# Patient Record
Sex: Male | Born: 1950 | Race: White | Hispanic: No | Marital: Married | Smoking: Never smoker
Health system: Southern US, Community
[De-identification: ages and names within clinical notes are randomized; demographics above are authoritative.]

## PROBLEM LIST (undated history)

## (undated) DIAGNOSIS — E785 Hyperlipidemia, unspecified: Secondary | ICD-10-CM

## (undated) DIAGNOSIS — I10 Essential (primary) hypertension: Secondary | ICD-10-CM

## (undated) HISTORY — DX: Hyperlipidemia, unspecified: E78.5

## (undated) HISTORY — PX: SHOULDER SURGERY: SHX246

## (undated) HISTORY — PX: BACK SURGERY: SHX140

## (undated) HISTORY — PX: CARDIAC VALVE REPLACEMENT: SHX585

## (undated) HISTORY — DX: Essential (primary) hypertension: I10

---

## 2010-12-22 ENCOUNTER — Ambulatory Visit (INDEPENDENT_AMBULATORY_CARE_PROVIDER_SITE_OTHER): Payer: BC Managed Care – PPO | Admitting: Internal Medicine

## 2010-12-22 ENCOUNTER — Encounter: Payer: Self-pay | Admitting: Internal Medicine

## 2010-12-22 VITALS — BP 122/80 | HR 70 | Temp 98.3°F | Resp 16 | Ht 69.5 in | Wt 200.0 lb

## 2010-12-22 DIAGNOSIS — Z0000 Encounter for general adult medical examination without abnormal findings: Secondary | ICD-10-CM

## 2010-12-22 MED ORDER — LISINOPRIL-HYDROCHLOROTHIAZIDE 10-12.5 MG PO TABS: 1.0000 | ORAL_TABLET | Freq: Every day | ORAL | Status: DC

## 2010-12-22 MED ORDER — SIMVASTATIN 20 MG PO TABS: 20.0000 mg | ORAL_TABLET | Freq: Every day | ORAL | Status: DC

## 2010-12-22 NOTE — Progress Notes (Signed)
Subjective:    Patient ID: Daniel Robinson, male    DOB: Jun 22, 1950, 60 y.o.   MRN: [id number]  HPI  60 year old patient who is in today to establish with our practice. He has relocated from [facility] approximately 4 months ago. He has treated hypertension and dyslipidemia. No real concerns or complaints Past medical history hypertension dyslipidemia. 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 He has lumbar disc disease and has had a laminectomy in the past. He has some chronic low back pain Surgical history right shoulder surgery 1996 lumbar laminectomy 1997. 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 He has one son and 5 adopted children;  is a lifelong nonsmoker  Family history father died age 7 complications of senile dementia. He had a fall resulted in a fractured neck Mother died age 29 for renal cancer 2 brothers in good health    Review of Systems  Constitutional: Negative for fever, chills, activity change, appetite change and fatigue.  HENT: Negative for hearing loss, ear pain, congestion, rhinorrhea, sneezing, mouth sores, trouble swallowing, neck pain, neck stiffness, dental problem, voice change, sinus pressure and tinnitus.   Eyes: Negative for photophobia, pain, redness and visual disturbance.  Respiratory: Negative for apnea, cough, choking, chest tightness, shortness of breath and wheezing.   Cardiovascular: Negative for chest pain, palpitations and leg swelling.  Gastrointestinal: Negative for nausea, vomiting, abdominal pain, diarrhea, constipation, blood in stool, abdominal distention, anal bleeding and rectal pain.  Genitourinary: Negative for dysuria, urgency, frequency, hematuria, flank pain, decreased urine volume, discharge, penile swelling, scrotal swelling, difficulty urinating, genital sores and testicular pain.    Musculoskeletal: Positive for back pain. Negative for myalgias, joint swelling, arthralgias and gait problem.  Skin: Negative for color change, rash and wound.  Neurological: Negative for dizziness, tremors, seizures, syncope, facial asymmetry, speech difficulty, weakness, light-headedness, numbness and headaches.  Hematological: Negative for adenopathy. Does not bruise/bleed easily.  Psychiatric/Behavioral: Negative for suicidal ideas, hallucinations, behavioral problems, confusion, sleep disturbance, self-injury, dysphoric mood, decreased concentration and agitation. The patient is not nervous/anxious.        Objective:   Physical Exam  Constitutional: He appears well-developed and well-nourished.  HENT:  Head: Normocephalic and atraumatic.  Right Ear: External ear normal.  Left Ear: External ear normal.  Nose: Nose normal.  Mouth/Throat: Oropharynx is clear and moist.  Eyes: Conjunctivae and EOM are normal. Pupils are equal, round, and reactive to light. No scleral icterus.  Neck: Normal range of motion. Neck supple. No JVD present. No thyromegaly present.  Cardiovascular: Regular rhythm, normal heart sounds and intact distal pulses.  Exam reveals no gallop and no friction rub.   No murmur heard. Pulmonary/Chest: Effort normal and breath sounds normal. He exhibits no tenderness.  Abdominal: Soft. Bowel sounds are normal. He exhibits no distension and no mass. There is no tenderness.  Genitourinary: Prostate normal and penis normal. Guaiac negative stool.  Musculoskeletal: Normal range of motion. He exhibits no edema and no tenderness.  Lymphadenopathy:    He has no cervical adenopathy.  Neurological: He is alert. He has normal reflexes. No cranial nerve deficit. Coordination normal.  Skin: Skin is warm and dry. No rash noted.  Psychiatric: He has a normal mood and affect. His behavior is normal.          Assessment & Plan:   Hypertension. We'll resume the  lisinopril  hydrochlorothiazide Dyslipidemia. We'll resume simvastatin  Recheck 6 months Colonoscopy encouraged

## 2010-12-22 NOTE — Patient Instructions (Signed)
Limit your sodium (Salt) intake    It is important that you exercise regularly, at least 20 minutes 3 to 4 times per week.  If you develop chest pain or shortness of breath seek  medical attention.   Please check your blood pressure on a regular basis.  If it is consistently greater than 150/90, please make an office appointment.  Schedule your colonoscopy to help detect colon cancer.  Return in 6 months for follow-up  

## 2012-01-16 ENCOUNTER — Other Ambulatory Visit: Payer: Self-pay | Admitting: Internal Medicine

## 2012-08-23 ENCOUNTER — Encounter: Payer: Self-pay | Admitting: Internal Medicine

## 2012-08-23 ENCOUNTER — Ambulatory Visit (INDEPENDENT_AMBULATORY_CARE_PROVIDER_SITE_OTHER): Payer: BC Managed Care – PPO | Admitting: Internal Medicine

## 2012-08-23 VITALS — BP 140/90 | HR 76 | Temp 99.0°F | Resp 20 | Wt 201.0 lb

## 2012-08-23 DIAGNOSIS — I10 Essential (primary) hypertension: Secondary | ICD-10-CM

## 2012-08-23 DIAGNOSIS — H6120 Impacted cerumen, unspecified ear: Secondary | ICD-10-CM

## 2012-08-23 DIAGNOSIS — H6123 Impacted cerumen, bilateral: Secondary | ICD-10-CM

## 2012-08-23 MED ORDER — LISINOPRIL-HYDROCHLOROTHIAZIDE 10-12.5 MG PO TABS: ORAL_TABLET | ORAL | Status: DC

## 2012-08-23 MED ORDER — SIMVASTATIN 20 MG PO TABS: ORAL_TABLET | ORAL | Status: DC

## 2012-08-23 NOTE — Patient Instructions (Signed)
Limit your sodium (Salt) intake  Please check your blood pressure on a regular basis.  If it is consistently greater than 150/90, please make an office appointment.  Return in 6 months for follow-up   

## 2012-08-23 NOTE — Progress Notes (Signed)
  Subjective:    Patient ID: Daniel Robinson, male    DOB: 09/26/1950, 62 y.o.   MRN: [id number]  HPI  62 year old patient who has hypertension and dyslipidemia. He is here today with a cerumen impaction. Review of Systems  HENT: Positive for hearing loss.        Objective:   Physical Exam  Constitutional: He appears well-developed and well-nourished. No distress.  Blood pressure 140/90 off medicine  HENT:  Bilateral cerumen impactions. Canals irrigated until clear          Assessment & Plan:  Cerumen impactions. Irrigated Hypertension stable  Medicines refilled schedule CPX

## 2012-12-13 ENCOUNTER — Other Ambulatory Visit: Payer: BC Managed Care – PPO

## 2012-12-16 ENCOUNTER — Other Ambulatory Visit (INDEPENDENT_AMBULATORY_CARE_PROVIDER_SITE_OTHER): Payer: BC Managed Care – PPO

## 2012-12-16 DIAGNOSIS — Z0000 Encounter for general adult medical examination without abnormal findings: Secondary | ICD-10-CM

## 2012-12-16 LAB — TSH: TSH: 1.43 u[IU]/mL (ref 0.35–5.50)

## 2012-12-16 LAB — LIPID PANEL
LDL Cholesterol: 101 mg/dL — ABNORMAL HIGH (ref 0–99)
Triglycerides: 179 mg/dL — ABNORMAL HIGH (ref 0.0–149.0)
VLDL: 35.8 mg/dL (ref 0.0–40.0)

## 2012-12-16 LAB — PSA: PSA: 0.98 ng/mL (ref 0.10–4.00)

## 2012-12-16 LAB — CBC WITH DIFFERENTIAL/PLATELET
Basophils Absolute: 0 10*3/uL (ref 0.0–0.1)
Eosinophils Absolute: 0.3 10*3/uL (ref 0.0–0.7)
Hemoglobin: 15.2 g/dL (ref 13.0–17.0)
Lymphocytes Relative: 33.2 % (ref 12.0–46.0)
MCHC: 34.6 g/dL (ref 30.0–36.0)
Neutro Abs: 4.3 10*3/uL (ref 1.4–7.7)
Platelets: 244 10*3/uL (ref 150.0–400.0)
RDW: 13.1 % (ref 11.5–14.6)

## 2012-12-16 LAB — POCT URINALYSIS DIPSTICK
Ketones, UA: NEGATIVE
Leukocytes, UA: NEGATIVE
Protein, UA: NEGATIVE
Spec Grav, UA: 1.015
Urobilinogen, UA: 0.2
pH, UA: 7

## 2012-12-16 LAB — BASIC METABOLIC PANEL
BUN: 16 mg/dL (ref 6–23)
CO2: 30 mEq/L (ref 19–32)
Calcium: 9.5 mg/dL (ref 8.4–10.5)
Creatinine, Ser: 0.9 mg/dL (ref 0.4–1.5)
Glucose, Bld: 86 mg/dL (ref 70–99)
Sodium: 139 mEq/L (ref 135–145)

## 2012-12-16 LAB — HEPATIC FUNCTION PANEL
Albumin: 4.4 g/dL (ref 3.5–5.2)
Alkaline Phosphatase: 37 U/L — ABNORMAL LOW (ref 39–117)

## 2012-12-20 ENCOUNTER — Ambulatory Visit (INDEPENDENT_AMBULATORY_CARE_PROVIDER_SITE_OTHER): Payer: BC Managed Care – PPO | Admitting: Internal Medicine

## 2012-12-20 ENCOUNTER — Encounter: Payer: Self-pay | Admitting: Internal Medicine

## 2012-12-20 VITALS — BP 122/86 | HR 65 | Temp 98.3°F | Resp 20 | Ht 69.5 in | Wt 205.0 lb

## 2012-12-20 DIAGNOSIS — D2360 Other benign neoplasm of skin of unspecified upper limb, including shoulder: Secondary | ICD-10-CM

## 2012-12-20 DIAGNOSIS — M7661 Achilles tendinitis, right leg: Secondary | ICD-10-CM

## 2012-12-20 DIAGNOSIS — I10 Essential (primary) hypertension: Secondary | ICD-10-CM

## 2012-12-20 DIAGNOSIS — D2361 Other benign neoplasm of skin of right upper limb, including shoulder: Secondary | ICD-10-CM

## 2012-12-20 DIAGNOSIS — Z23 Encounter for immunization: Secondary | ICD-10-CM

## 2012-12-20 DIAGNOSIS — M7660 Achilles tendinitis, unspecified leg: Secondary | ICD-10-CM

## 2012-12-20 MED ORDER — LISINOPRIL-HYDROCHLOROTHIAZIDE 10-12.5 MG PO TABS: ORAL_TABLET | ORAL | Status: DC

## 2012-12-20 MED ORDER — SIMVASTATIN 20 MG PO TABS: ORAL_TABLET | ORAL | Status: DC

## 2012-12-20 NOTE — Progress Notes (Signed)
Subjective:    Patient ID: Daniel Robinson, male    DOB: April 18, 1950, 62 y.o.   MRN: [id number]  HPI Subjective:    Patient ID: Daniel Robinson, male    DOB: 1950/04/19, 62 y.o.   MRN: [id number]  HPI  62 year-old patient who is in today for an annual exam. He is status with the practice approximately 2 years ago. He has relocated from [facility]. He has treated hypertension and dyslipidemia. No real concerns or complaints   Past medical history hypertension dyslipidemia. 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 He has lumbar disc disease and has had a laminectomy in the past. He has some chronic low back pain  Surgical history right shoulder surgery 1996 lumbar laminectomy 1997. 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 He has one son and 5 adopted children;  is a lifelong nonsmoker  Family history father died age 78 complications of senile dementia. He had a fall resulted in a fractured neck Mother died age 40  From renal cancer 2 brothers in good health    Review of Systems  Constitutional: Negative for fever, chills, activity change, appetite change and fatigue.  HENT: Negative for hearing loss, ear pain, congestion, rhinorrhea, sneezing, mouth sores, trouble swallowing, neck pain, neck stiffness, dental problem, voice change, sinus pressure and tinnitus.   Eyes: Negative for photophobia, pain, redness and visual disturbance.  Respiratory: Negative for apnea, cough, choking, chest tightness, shortness of breath and wheezing.   Cardiovascular: Negative for chest pain, palpitations and leg swelling.  Gastrointestinal: Negative for nausea, vomiting, abdominal pain, diarrhea, constipation, blood in stool, abdominal distention, anal bleeding and rectal pain.  Genitourinary: Negative for dysuria, urgency, frequency, hematuria, flank  pain, decreased urine volume, discharge, penile swelling, scrotal swelling, difficulty urinating, genital sores and testicular pain.  Musculoskeletal: Positive for back pain. Negative for myalgias, joint swelling, arthralgias and gait problem.  Skin: Negative for color change, rash and wound.  Neurological: Negative for dizziness, tremors, seizures, syncope, facial asymmetry, speech difficulty, weakness, light-headedness, numbness and headaches.  Hematological: Negative for adenopathy. Does not bruise/bleed easily.  Psychiatric/Behavioral: Negative for suicidal ideas, hallucinations, behavioral problems, confusion, sleep disturbance, self-injury, dysphoric mood, decreased concentration and agitation. The patient is not nervous/anxious.        Objective:   Physical Exam  Constitutional: He appears well-developed and well-nourished.  HENT:  Head: Normocephalic and atraumatic.  Right Ear: External ear normal.  Left Ear: External ear normal.  Nose: Nose normal.  Mouth/Throat: Oropharynx is clear and moist.  Eyes: Conjunctivae and EOM are normal. Pupils are equal, round, and reactive to light. No scleral icterus.  Neck: Normal range of motion. Neck supple. No JVD present. No thyromegaly present.  Cardiovascular: Regular rhythm, normal heart sounds and intact distal pulses.  Exam reveals no gallop and no friction rub.   No murmur heard. Pulmonary/Chest: Effort normal and breath sounds normal. He exhibits no tenderness.  Abdominal: Soft. Bowel sounds are normal. He exhibits no distension and no mass. There is no tenderness.  Genitourinary: Prostate normal and penis normal. Guaiac negative stool.  Musculoskeletal: Normal range of motion. He exhibits no edema and no tenderness.  Lymphadenopathy:    He has no cervical adenopathy.  Neurological: He is alert. He has normal reflexes. No cranial nerve deficit. Coordination normal.  Skin: Skin is warm and dry. No rash  noted.  Psychiatric: He has a  normal mood and affect. His behavior is normal.          Assessment & Plan:   Hypertension. We'll resume the lisinopril hydrochlorothiazide Dyslipidemia. We'll resume simvastatin  Recheck 6 months Colonoscopy encouraged    Review of Systems See above    Objective:   Physical Exam  Musculoskeletal:  Soft tissue swelling right distal Achilles tendon near insertion site  Skin:  Approximate 8mm pigmented lesion right shoulder area    See above      Assessment & Plan:   Preventive health exam Colonoscopy encouraged Rule out melanoma right shoulder area Right Achilles tendinopathy. We'll set up for orthopedic evaluation  Hypertension stable Dyslipidemia  Continue home blood pressure monitoring and statin therapy

## 2012-12-20 NOTE — Addendum Note (Signed)
Addended by: Jimmye Norman on: 12/20/2012 12:09 PM   Modules accepted: Orders

## 2012-12-20 NOTE — Patient Instructions (Addendum)
Limit your sodium (Salt) intake    It is important that you exercise regularly, at least 20 minutes 3 to 4 times per week.  If you develop chest pain or shortness of breath seek  medical attention.  Please check your blood pressure on a regular basis.  If it is consistently greater than 150/90, please make an office appointment.  Return in one year for follow-up  Schedule your colonoscopy to help detect colon cancer.Achilles Tendinitis  with Rehab Achilles tendinitis is a disorder of the Achilles tendon. The Achilles tendon connects the large calf muscles (Gastrocnemius and Soleus) to the heel bone (calcaneus). This tendon is sometimes called the heel cord. It is important for pushing-off and standing on your toes and is important for walking, running, or jumping. Tendinitis is often caused by overuse and repetitive microtrauma. SYMPTOMS  Pain, tenderness, swelling, warmth, and redness may occur over the Achilles tendon even at rest.  Pain with pushing off, or flexing or extending the ankle.  Pain that is worsened after or during activity. CAUSES   Overuse sometimes seen with rapid increase in exercise programs or in sports requiring running and jumping.  Poor physical conditioning (strength and flexibility or endurance).  Running sports, especially training running down hills.  Inadequate warm-up before practice or play or failure to stretch before participation.  Injury to the tendon. PREVENTION   Warm up and stretch before practice or competition.  Allow time for adequate rest and recovery between practices and competition.  Keep up conditioning.  Keep up ankle and leg flexibility.  Improve or keep muscle strength and endurance.  Improve cardiovascular fitness.  Use proper technique.  Use proper equipment (shoes, skates).  To help prevent recurrence, taping, protective strapping, or an adhesive bandage may be recommended for several weeks after healing is  complete. PROGNOSIS   Recovery may take weeks to several months to heal.  Longer recovery is expected if symptoms have been prolonged.  Recovery is usually quicker if the inflammation is due to a direct blow as compared with overuse or sudden strain. RELATED COMPLICATIONS   Healing time will be prolonged if the condition is not correctly treated. The injury must be given plenty of time to heal.  Symptoms can reoccur if activity is resumed too soon.  Untreated, tendinitis may increase the risk of tendon rupture requiring additional time for recovery and possibly surgery. TREATMENT   The first treatment consists of rest anti-inflammatory medication, and ice to relieve the pain.  Stretching and strengthening exercises after resolution of pain will likely help reduce the risk of recurrence. Referral to a physical therapist or athletic trainer for further evaluation and treatment may be helpful.  A walking boot or cast may be recommended to rest the Achilles tendon. This can help break the cycle of inflammation and microtrauma.  Arch supports (orthotics) may be prescribed or recommended by your caregiver as an adjunct to therapy and rest.  Surgery to remove the inflamed tendon lining or degenerated tendon tissue is rarely necessary and has shown less than predictable results. MEDICATION   Nonsteroidal anti-inflammatory medications, such as aspirin and ibuprofen, may be used for pain and inflammation relief. Do not take within 7 days before surgery. Take these as directed by your caregiver. Contact your caregiver immediately if any bleeding, stomach upset, or signs of allergic reaction occur. Other minor pain relievers, such as acetaminophen, may also be used.  Pain relievers may be prescribed as necessary by your caregiver. Do not take prescription pain  medication for longer than 4 to 7 days. Use only as directed and only as much as you need.  Cortisone injections are rarely indicated.  Cortisone injections may weaken tendons and predispose to rupture. It is better to give the condition more time to heal than to use them. HEAT AND COLD  Cold is used to relieve pain and reduce inflammation for acute and chronic Achilles tendinitis. Cold should be applied for 10 to 15 minutes every 2 to 3 hours for inflammation and pain and immediately after any activity that aggravates your symptoms. Use ice packs or an ice massage.  Heat may be used before performing stretching and strengthening activities prescribed by your caregiver. Use a heat pack or a warm soak. SEEK MEDICAL CARE IF:  Symptoms get worse or do not improve in 2 weeks despite treatment.  New, unexplained symptoms develop. Drugs used in treatment may produce side effects. EXERCISES RANGE OF MOTION (ROM) AND STRETCHING EXERCISES - Achilles Tendinitis  These exercises may help you when beginning to rehabilitate your injury. Your symptoms may resolve with or without further involvement from your physician, physical therapist or athletic trainer. While completing these exercises, remember:   Restoring tissue flexibility helps normal motion to return to the joints. This allows healthier, less painful movement and activity.  An effective stretch should be held for at least 30 seconds.  A stretch should never be painful. You should only feel a gentle lengthening or release in the stretched tissue. STRETCH  Gastroc, Standing   Place hands on wall.  Extend right / left leg, keeping the front knee somewhat bent.  Slightly point your toes inward on your back foot.  Keeping your right / left heel on the floor and your knee straight, shift your weight toward the wall, not allowing your back to arch.  You should feel a gentle stretch in the right / left calf. Hold this position for __________ seconds. Repeat __________ times. Complete this stretch __________ times per day. STRETCH  Soleus, Standing   Place hands on  wall.  Extend right / left leg, keeping the other knee somewhat bent.  Slightly point your toes inward on your back foot.  Keep your right / left heel on the floor, bend your back knee, and slightly shift your weight over the back leg so that you feel a gentle stretch deep in your back calf.  Hold this position for __________ seconds. Repeat __________ times. Complete this stretch __________ times per day. STRETCH  Gastrocsoleus, Standing  Note: This exercise can place a lot of stress on your foot and ankle. Please complete this exercise only if specifically instructed by your caregiver.   Place the ball of your right / left foot on a step, keeping your other foot firmly on the same step.  Hold on to the wall or a rail for balance.  Slowly lift your other foot, allowing your body weight to press your heel down over the edge of the step.  You should feel a stretch in your right / left calf.  Hold this position for __________ seconds.  Repeat this exercise with a slight bend in your knee. Repeat __________ times. Complete this stretch __________ times per day.  STRENGTHENING EXERCISES - Achilles Tendinitis These exercises may help you when beginning to rehabilitate your injury. They may resolve your symptoms with or without further involvement from your physician, physical therapist or athletic trainer. While completing these exercises, remember:   Muscles can gain both the endurance and  the strength needed for everyday activities through controlled exercises.  Complete these exercises as instructed by your physician, physical therapist or athletic trainer. Progress the resistance and repetitions only as guided.  You may experience muscle soreness or fatigue, but the pain or discomfort you are trying to eliminate should never worsen during these exercises. If this pain does worsen, stop and make certain you are following the directions exactly. If the pain is still present after  adjustments, discontinue the exercise until you can discuss the trouble with your clinician. STRENGTH - Plantar-flexors   Sit with your right / left leg extended. Holding onto both ends of a rubber exercise band/tubing, loop it around the ball of your foot. Keep a slight tension in the band.  Slowly push your toes away from you, pointing them downward.  Hold this position for __________ seconds. Return slowly, controlling the tension in the band/tubing. Repeat __________ times. Complete this exercise __________ times per day.  STRENGTH - Plantar-flexors   Stand with your feet shoulder width apart. Steady yourself with a wall or table using as little support as needed.  Keeping your weight evenly spread over the width of your feet, rise up on your toes.*  Hold this position for __________ seconds. Repeat __________ times. Complete this exercise __________ times per day.  *If this is too easy, shift your weight toward your right / left leg until you feel challenged. Ultimately, you may be asked to do this exercise with your right / left foot only. STRENGTH  Plantar-flexors, Eccentric  Note: This exercise can place a lot of stress on your foot and ankle. Please complete this exercise only if specifically instructed by your caregiver.   Place the balls of your feet on a step. With your hands, use only enough support from a wall or rail to keep your balance.  Keep your knees straight and rise up on your toes.  Slowly shift your weight entirely to your right / left toes and pick up your opposite foot. Gently and with controlled movement, lower your weight through your right / left foot so that your heel drops below the level of the step. You will feel a slight stretch in the back of your calf at the end position.  Use the healthy leg to help rise up onto the balls of both feet, then lower weight only on the right / left leg again. Build up to 15 repetitions. Then progress to 3 consecutive sets  of 15 repetitions.*  After completing the above exercise, complete the same exercise with a slight knee bend (about 30 degrees). Again, build up to 15 repetitions. Then progress to 3 consecutive sets of 15 repetitions.* Perform this exercise __________ times per day.  *When you easily complete 3 sets of 15, your physician, physical therapist or athletic trainer may advise you to add resistance by wearing a backpack filled with additional weight. STRENGTH - Plantar Flexors, Seated   Sit on a chair that allows your feet to rest flat on the ground. If necessary, sit at the edge of the chair.  Keeping your toes firmly on the ground, lift your right / left heel as far as you can without increasing any discomfort in your ankle. Repeat __________ times. Complete this exercise __________ times a day. *If instructed by your physician, physical therapist or athletic trainer, you may add ____________________ of resistance by placing a weighted object on your right / left knee. Document Released: 10/12/2004 Document Revised: 06/05/2011 Document Reviewed: 06/25/2008 [employer]  Patient Information 2014 [employer]  

## 2013-01-18 ENCOUNTER — Other Ambulatory Visit: Payer: Self-pay | Admitting: Internal Medicine

## 2013-03-23 ENCOUNTER — Other Ambulatory Visit: Payer: Self-pay | Admitting: Internal Medicine

## 2013-04-30 ENCOUNTER — Other Ambulatory Visit: Payer: Self-pay | Admitting: Internal Medicine

## 2013-05-31 ENCOUNTER — Other Ambulatory Visit: Payer: Self-pay | Admitting: Internal Medicine

## 2013-07-31 ENCOUNTER — Other Ambulatory Visit: Payer: Self-pay | Admitting: Internal Medicine

## 2013-10-02 ENCOUNTER — Other Ambulatory Visit: Payer: Self-pay | Admitting: Internal Medicine

## 2013-11-30 ENCOUNTER — Other Ambulatory Visit: Payer: Self-pay | Admitting: Internal Medicine

## 2013-12-31 ENCOUNTER — Other Ambulatory Visit: Payer: Self-pay | Admitting: Internal Medicine

## 2014-03-02 ENCOUNTER — Other Ambulatory Visit: Payer: Self-pay | Admitting: Internal Medicine

## 2014-04-02 ENCOUNTER — Other Ambulatory Visit: Payer: Self-pay | Admitting: Internal Medicine

## 2014-05-02 ENCOUNTER — Other Ambulatory Visit: Payer: Self-pay | Admitting: Internal Medicine

## 2015-01-13 ENCOUNTER — Encounter: Payer: Self-pay | Admitting: Internal Medicine

## 2015-01-13 ENCOUNTER — Ambulatory Visit (INDEPENDENT_AMBULATORY_CARE_PROVIDER_SITE_OTHER): Payer: BLUE CROSS/BLUE SHIELD | Admitting: Internal Medicine

## 2015-01-13 VITALS — BP 152/100 | HR 66 | Temp 98.3°F | Resp 20 | Ht 69.5 in | Wt 200.0 lb

## 2015-01-13 DIAGNOSIS — Z23 Encounter for immunization: Secondary | ICD-10-CM

## 2015-01-13 DIAGNOSIS — E785 Hyperlipidemia, unspecified: Secondary | ICD-10-CM

## 2015-01-13 DIAGNOSIS — I10 Essential (primary) hypertension: Secondary | ICD-10-CM | POA: Diagnosis not present

## 2015-01-13 DIAGNOSIS — E782 Mixed hyperlipidemia: Secondary | ICD-10-CM | POA: Insufficient documentation

## 2015-01-13 MED ORDER — LISINOPRIL-HYDROCHLOROTHIAZIDE 10-12.5 MG PO TABS: 1.0000 | ORAL_TABLET | Freq: Every day | ORAL | Status: DC

## 2015-01-13 MED ORDER — SIMVASTATIN 20 MG PO TABS: 20.0000 mg | ORAL_TABLET | Freq: Every day | ORAL | Status: DC

## 2015-01-13 NOTE — Progress Notes (Signed)
Subjective:    Patient ID: Daniel Robinson, male    DOB: 04/11/1950, 64 y.o.   MRN: [id number]  HPI  BP Readings from Last 3 Encounters:  01/13/15 152/100  12/20/12 122/86  08/23/12 76/7   64 year old patient who has not been seen in over 2 years.  He has had issues with cerumen impactions in the past and presents with complaints of diminished auditory acuity bilaterally. He has been off medications for blood pressure and lipid control. Otherwise, no complaints  Social history.  Has a son that is stable following a bone marrow transplant for aplastic anemia and PNH  Past Medical History  Diagnosis Date  . Hyperlipidemia   . Hypertension     Social History   Social History  . Marital Status: Married    Spouse Name: N/A  . Number of Children: N/A  . Years of Education: N/A   Occupational History  . Not on file.   Social History Main Topics  . Smoking status: Never Smoker   . Smokeless tobacco: Never Used  . Alcohol Use: Yes  . Drug Use: No  . Sexual Activity: Not on file   Other Topics Concern  . Not on file   Social History Narrative    Past Surgical History  Procedure Laterality Date  . Back surgery    . Shoulder surgery      Family History  Problem Relation Age of Onset  . Hyperlipidemia Mother   . Hypertension Mother     No Known Allergies  No current outpatient prescriptions on file prior to visit.   No current facility-administered medications on file prior to visit.    BP 152/100 mmHg  Pulse 66  Temp(Src) 98.3 F (36.8 C) (Oral)  Resp 20  Ht 5' 9.5" (1.765 m)  Wt 200 lb (90.719 kg)  BMI 29.12 kg/m2  SpO2 98%      Review of Systems  Constitutional: Negative for fever, chills, appetite change and fatigue.  HENT: Negative for congestion, dental problem, ear pain, hearing loss, sore throat, tinnitus, trouble swallowing and voice change.   Eyes: Negative for pain, discharge and visual disturbance.  Respiratory: Negative for  cough, chest tightness, wheezing and stridor.   Cardiovascular: Negative for chest pain, palpitations and leg swelling.  Gastrointestinal: Negative for nausea, vomiting, abdominal pain, diarrhea, constipation, blood in stool and abdominal distention.  Genitourinary: Negative for urgency, hematuria, flank pain, discharge, difficulty urinating and genital sores.  Musculoskeletal: Negative for myalgias, back pain, joint swelling, arthralgias, gait problem and neck stiffness.  Skin: Negative for rash.  Neurological: Negative for dizziness, syncope, speech difficulty, weakness, numbness and headaches.  Hematological: Negative for adenopathy. Does not bruise/bleed easily.  Psychiatric/Behavioral: Negative for behavioral problems and dysphoric mood. The patient is not nervous/anxious.        Objective:   Physical Exam  Constitutional: He is oriented to person, place, and time. He appears well-developed.  Blood pressure 160/100  HENT:  Head: Normocephalic.  Right Ear: External ear normal.  Left Ear: External ear normal.  Eyes: Conjunctivae and EOM are normal.  Neck: Normal range of motion.  Cardiovascular: Normal rate and normal heart sounds.   Pulmonary/Chest: Breath sounds normal.  Abdominal: Bowel sounds are normal.  Musculoskeletal: Normal range of motion. He exhibits no edema or tenderness.  Neurological: He is alert and oriented to person, place, and time.  Psychiatric: He has a normal mood and affect. His behavior is normal.  Assessment & Plan:   Bilateral cerumen impactions.  Procedure note -both canals irrigated until clear Hypertension.  Poor control off medication.  Compliance issues discussed and stressed.  Will resume combination therapy Dyslipidemia.  Resume statin therapy  Home blood pressure monitoring.  Encouraged Schedule CPX  Preventive health.  Flu vaccine administered

## 2015-01-13 NOTE — Progress Notes (Signed)
Pre visit review using our clinic review tool, if applicable. No additional management support is needed unless otherwise documented below in the visit note. 

## 2015-01-13 NOTE — Patient Instructions (Signed)
Limit your sodium (Salt) intake  Please check your blood pressure on a regular basis.  If it is consistently greater than 150/90, please make an office appointment.    It is important that you exercise regularly, at least 20 minutes 3 to 4 times per week.  If you develop chest pain or shortness of breath seek  medical attention.  Return in 6 months for follow-up  

## 2015-07-21 ENCOUNTER — Other Ambulatory Visit: Payer: Self-pay | Admitting: Internal Medicine

## 2015-10-11 ENCOUNTER — Other Ambulatory Visit: Payer: Self-pay | Admitting: Internal Medicine

## 2015-10-11 NOTE — Telephone Encounter (Signed)
Rx refill sent to pharmacy. 

## 2015-10-13 DIAGNOSIS — Z85828 Personal history of other malignant neoplasm of skin: Secondary | ICD-10-CM | POA: Diagnosis not present

## 2015-10-13 DIAGNOSIS — L821 Other seborrheic keratosis: Secondary | ICD-10-CM | POA: Diagnosis not present

## 2015-10-13 DIAGNOSIS — B078 Other viral warts: Secondary | ICD-10-CM | POA: Diagnosis not present

## 2015-10-13 DIAGNOSIS — Z85820 Personal history of malignant melanoma of skin: Secondary | ICD-10-CM | POA: Diagnosis not present

## 2015-10-13 DIAGNOSIS — L814 Other melanin hyperpigmentation: Secondary | ICD-10-CM | POA: Diagnosis not present

## 2016-01-20 ENCOUNTER — Other Ambulatory Visit: Payer: Self-pay | Admitting: Internal Medicine

## 2016-04-14 ENCOUNTER — Other Ambulatory Visit: Payer: Self-pay | Admitting: Internal Medicine

## 2016-05-16 DIAGNOSIS — D1801 Hemangioma of skin and subcutaneous tissue: Secondary | ICD-10-CM | POA: Diagnosis not present

## 2016-05-16 DIAGNOSIS — L814 Other melanin hyperpigmentation: Secondary | ICD-10-CM | POA: Diagnosis not present

## 2016-05-16 DIAGNOSIS — L821 Other seborrheic keratosis: Secondary | ICD-10-CM | POA: Diagnosis not present

## 2016-05-16 DIAGNOSIS — Z85828 Personal history of other malignant neoplasm of skin: Secondary | ICD-10-CM | POA: Diagnosis not present

## 2016-07-24 ENCOUNTER — Other Ambulatory Visit: Payer: Self-pay | Admitting: Internal Medicine

## 2016-10-16 ENCOUNTER — Other Ambulatory Visit: Payer: Self-pay | Admitting: Internal Medicine

## 2016-11-06 DIAGNOSIS — D1801 Hemangioma of skin and subcutaneous tissue: Secondary | ICD-10-CM | POA: Diagnosis not present

## 2016-11-06 DIAGNOSIS — L814 Other melanin hyperpigmentation: Secondary | ICD-10-CM | POA: Diagnosis not present

## 2016-11-06 DIAGNOSIS — L821 Other seborrheic keratosis: Secondary | ICD-10-CM | POA: Diagnosis not present

## 2016-11-06 DIAGNOSIS — L820 Inflamed seborrheic keratosis: Secondary | ICD-10-CM | POA: Diagnosis not present

## 2016-11-06 DIAGNOSIS — D179 Benign lipomatous neoplasm, unspecified: Secondary | ICD-10-CM | POA: Diagnosis not present

## 2016-12-14 ENCOUNTER — Encounter: Payer: Self-pay | Admitting: Internal Medicine

## 2017-01-12 ENCOUNTER — Other Ambulatory Visit: Payer: Self-pay | Admitting: Internal Medicine

## 2017-01-12 ENCOUNTER — Other Ambulatory Visit: Payer: Self-pay

## 2017-01-12 MED ORDER — LISINOPRIL-HYDROCHLOROTHIAZIDE 10-12.5 MG PO TABS: 1.0000 | ORAL_TABLET | Freq: Every day | ORAL | 0 refills | Status: DC

## 2017-01-13 ENCOUNTER — Other Ambulatory Visit: Payer: Self-pay | Admitting: Internal Medicine

## 2017-04-13 ENCOUNTER — Other Ambulatory Visit: Payer: Self-pay | Admitting: Internal Medicine

## 2017-05-07 DIAGNOSIS — Z85820 Personal history of malignant melanoma of skin: Secondary | ICD-10-CM | POA: Diagnosis not present

## 2017-05-07 DIAGNOSIS — D1801 Hemangioma of skin and subcutaneous tissue: Secondary | ICD-10-CM | POA: Diagnosis not present

## 2017-05-07 DIAGNOSIS — B351 Tinea unguium: Secondary | ICD-10-CM | POA: Diagnosis not present

## 2017-05-07 DIAGNOSIS — L814 Other melanin hyperpigmentation: Secondary | ICD-10-CM | POA: Diagnosis not present

## 2017-07-14 ENCOUNTER — Other Ambulatory Visit: Payer: Self-pay | Admitting: Internal Medicine

## 2017-08-16 ENCOUNTER — Encounter: Payer: Self-pay | Admitting: Family Medicine

## 2017-08-16 ENCOUNTER — Ambulatory Visit: Payer: BLUE CROSS/BLUE SHIELD | Admitting: Family Medicine

## 2017-08-16 VITALS — BP 126/80 | HR 76 | Temp 98.2°F | Ht 69.5 in | Wt 213.4 lb

## 2017-08-16 DIAGNOSIS — H6123 Impacted cerumen, bilateral: Secondary | ICD-10-CM | POA: Diagnosis not present

## 2017-08-16 NOTE — Progress Notes (Signed)
   Subjective:    Patient ID: Daniel Robinson, male    DOB: 1950/07/31, 67 y.o.   MRN: [id number]  HPI Here for hearing loss in the right ear a few days ago. He has frequent wax build ups. No pain or discomfort.    Review of Systems  Constitutional: Negative.   HENT: Positive for hearing loss. Negative for congestion, ear discharge and ear pain.   Respiratory: Negative.   Cardiovascular: Negative.        Objective:   Physical Exam  Constitutional: He appears well-developed and well-nourished.  HENT:  Both ear canals are full of cerumen   Cardiovascular: Normal rate, regular rhythm, normal heart sounds and intact distal pulses.  Pulmonary/Chest: Effort normal and breath sounds normal.          Assessment & Plan:  Cerumen impactions, both were cleared with water irrigations.  Alysia Penna, MD

## 2017-10-15 ENCOUNTER — Other Ambulatory Visit: Payer: Self-pay | Admitting: Internal Medicine

## 2017-10-15 NOTE — Telephone Encounter (Signed)
Patient has upcoming CPE in August.

## 2017-11-14 ENCOUNTER — Other Ambulatory Visit: Payer: Self-pay | Admitting: Internal Medicine

## 2017-11-14 ENCOUNTER — Ambulatory Visit (INDEPENDENT_AMBULATORY_CARE_PROVIDER_SITE_OTHER): Payer: BLUE CROSS/BLUE SHIELD | Admitting: Internal Medicine

## 2017-11-14 ENCOUNTER — Encounter: Payer: Self-pay | Admitting: Internal Medicine

## 2017-11-14 VITALS — BP 100/70 | HR 83 | Temp 98.1°F | Ht 69.0 in | Wt 203.4 lb

## 2017-11-14 DIAGNOSIS — Z0000 Encounter for general adult medical examination without abnormal findings: Secondary | ICD-10-CM

## 2017-11-14 DIAGNOSIS — Z1211 Encounter for screening for malignant neoplasm of colon: Secondary | ICD-10-CM

## 2017-11-14 LAB — TSH: TSH: 1.09 u[IU]/mL (ref 0.35–4.50)

## 2017-11-14 LAB — CBC WITH DIFFERENTIAL/PLATELET
Basophils Absolute: 0 10*3/uL (ref 0.0–0.1)
Basophils Relative: 0.5 % (ref 0.0–3.0)
EOS PCT: 2.2 % (ref 0.0–5.0)
Eosinophils Absolute: 0.2 10*3/uL (ref 0.0–0.7)
HCT: 48.6 % (ref 39.0–52.0)
HEMOGLOBIN: 16.5 g/dL (ref 13.0–17.0)
Lymphocytes Relative: 26.4 % (ref 12.0–46.0)
Lymphs Abs: 2.3 10*3/uL (ref 0.7–4.0)
MCHC: 34 g/dL (ref 30.0–36.0)
MCV: 91.5 fl (ref 78.0–100.0)
MONOS PCT: 7.8 % (ref 3.0–12.0)
Monocytes Absolute: 0.7 10*3/uL (ref 0.1–1.0)
Neutro Abs: 5.4 10*3/uL (ref 1.4–7.7)
Neutrophils Relative %: 63.1 % (ref 43.0–77.0)
Platelets: 297 10*3/uL (ref 150.0–400.0)
RBC: 5.32 Mil/uL (ref 4.22–5.81)
RDW: 13.2 % (ref 11.5–15.5)
WBC: 8.6 10*3/uL (ref 4.0–10.5)

## 2017-11-14 LAB — COMPREHENSIVE METABOLIC PANEL
ALBUMIN: 4.6 g/dL (ref 3.5–5.2)
ALT: 19 U/L (ref 0–53)
AST: 15 U/L (ref 0–37)
Alkaline Phosphatase: 37 U/L — ABNORMAL LOW (ref 39–117)
BUN: 20 mg/dL (ref 6–23)
CHLORIDE: 101 meq/L (ref 96–112)
CO2: 29 mEq/L (ref 19–32)
Calcium: 10.2 mg/dL (ref 8.4–10.5)
Creatinine, Ser: 0.99 mg/dL (ref 0.40–1.50)
GFR: 80.66 mL/min (ref >60.00)
Glucose, Bld: 91 mg/dL (ref 70–99)
POTASSIUM: 4.1 meq/L (ref 3.5–5.1)
SODIUM: 139 meq/L (ref 135–145)
Total Bilirubin: 0.8 mg/dL (ref 0.2–1.2)
Total Protein: 7.5 g/dL (ref 6.0–8.3)

## 2017-11-14 LAB — LIPID PANEL
CHOL/HDL RATIO: 5
Cholesterol: 220 mg/dL — ABNORMAL HIGH (ref 0–200)
HDL: 40.2 mg/dL (ref >39.00)
NonHDL: 179.32
Triglycerides: 334 mg/dL — ABNORMAL HIGH (ref 0.0–149.0)
VLDL: 66.8 mg/dL — AB (ref 0.0–40.0)

## 2017-11-14 LAB — PSA: PSA: 1.96 ng/mL (ref 0.10–4.00)

## 2017-11-14 LAB — LDL CHOLESTEROL, DIRECT: LDL DIRECT: 119 mg/dL

## 2017-11-14 MED ORDER — SIMVASTATIN 20 MG PO TABS: ORAL_TABLET | ORAL | 4 refills | Status: DC

## 2017-11-14 MED ORDER — LISINOPRIL-HYDROCHLOROTHIAZIDE 10-12.5 MG PO TABS: 1.0000 | ORAL_TABLET | Freq: Every day | ORAL | 4 refills | Status: DC

## 2017-11-14 NOTE — Progress Notes (Signed)
Subjective:    Patient ID: Daniel Robinson, male    DOB: Aug 12, 1950, 67 y.o.   MRN: [id number]  HPI  67 year old patient who is seen infrequently and is in today for a annual evaluation. He has a history of essential hypertension and dyslipidemia. No concerns or complaints  No prior colonoscopies  Family history fairly noncontributory mother died at 37 of complications of renal cancer apparently had prediabetes.  Father died at 102 with history of dementia. 2 brothers older in apparent good health with obesity only  Social history married one biological son.  5 adopted children  Past Medical History:  Diagnosis Date  . Hyperlipidemia   . Hypertension      Social History   Socioeconomic History  . Marital status: Married    Spouse name: Not on file  . Number of children: Not on file  . Years of education: Not on file  . Highest education level: Not on file  Occupational History  . Not on file  Social Needs  . Financial resource strain: Not on file  . Food insecurity:    Worry: Not on file    Inability: Not on file  . Transportation needs:    Medical: Not on file    Non-medical: Not on file  Tobacco Use  . Smoking status: Never Smoker  . Smokeless tobacco: Never Used  Substance and Sexual Activity  . Alcohol use: Yes  . Drug use: No  . Sexual activity: Not on file  Lifestyle  . Physical activity:    Days per week: Not on file    Minutes per session: Not on file  . Stress: Not on file  Relationships  . Social connections:    Talks on phone: Not on file    Gets together: Not on file    Attends religious service: Not on file    Active member of club or organization: Not on file    Attends meetings of clubs or organizations: Not on file    Relationship status: Not on file  . Intimate partner violence:    Fear of current or ex partner: Not on file    Emotionally abused: Not on file    Physically abused: Not on file    Forced sexual activity: Not on file    Other Topics Concern  . Not on file  Social History Narrative  . Not on file    Past Surgical History:  Procedure Laterality Date  . BACK SURGERY    . SHOULDER SURGERY      Family History  Problem Relation Age of Onset  . Hyperlipidemia Mother   . Hypertension Mother     No Known Allergies  No current outpatient medications on file prior to visit.   No current facility-administered medications on file prior to visit.     BP 100/70 (BP Location: Right Arm, Patient Position: Sitting, Cuff Size: Normal)   Pulse 83   Temp 98.1 F (36.7 C) (Oral)   Ht 5\' 9"  (1.753 m)   Wt 203 lb 6.4 oz (92.3 kg)   SpO2 96%   BMI 30.04 kg/m     Review of Systems  Constitutional: Negative for appetite change, chills, fatigue and fever.  HENT: Negative for congestion, dental problem, ear pain, hearing loss, sore throat, tinnitus, trouble swallowing and voice change.   Eyes: Negative for pain, discharge and visual disturbance.  Respiratory: Negative for cough, chest tightness, wheezing and stridor.   Cardiovascular: Negative for chest pain, palpitations and  leg swelling.  Gastrointestinal: Negative for abdominal distention, abdominal pain, blood in stool, constipation, diarrhea, nausea and vomiting.  Genitourinary: Negative for difficulty urinating, discharge, flank pain, genital sores, hematuria and urgency.  Musculoskeletal: Negative for arthralgias, back pain, gait problem, joint swelling, myalgias and neck stiffness.  Skin: Negative for rash.  Neurological: Negative for dizziness, syncope, speech difficulty, weakness, numbness and headaches.  Hematological: Negative for adenopathy. Does not bruise/bleed easily.  Psychiatric/Behavioral: Negative for behavioral problems and dysphoric mood. The patient is not nervous/anxious.        Objective:   Physical Exam  Constitutional: He appears well-developed and well-nourished.  Blood pressure 100/64 Weight 203  HENT:  Head:  Normocephalic and atraumatic.  Right Ear: External ear normal.  Left Ear: External ear normal.  Nose: Nose normal.  Mouth/Throat: Oropharynx is clear and moist.  Eyes: Pupils are equal, round, and reactive to light. Conjunctivae and EOM are normal. No scleral icterus.  Neck: Normal range of motion. Neck supple. No JVD present. No thyromegaly present.  Cardiovascular: Regular rhythm, normal heart sounds and intact distal pulses. Exam reveals no gallop and no friction rub.  No murmur heard. Pulmonary/Chest: Effort normal and breath sounds normal. He exhibits no tenderness.  Abdominal: Soft. Bowel sounds are normal. He exhibits no distension and no mass. There is no tenderness.  Genitourinary: Prostate normal and penis normal. Rectal exam shows guaiac negative stool.  Musculoskeletal: Normal range of motion. He exhibits no edema or tenderness.  High arches Some onychomycotic toenail changes  Lymphadenopathy:    He has no cervical adenopathy.  Neurological: He is alert. He has normal reflexes. No cranial nerve deficit. Coordination normal.  Skin: Skin is warm and dry. No rash noted.  Psychiatric: He has a normal mood and affect. His behavior is normal.          Assessment & Plan:   Preventive health examination Essential hypertension well-controlled Dyslipidemia.  Will review a lipid profile Overweight.  Patient seems motivated to try to lose some weight and become more active physically.  Target weight 175  Declines a colonoscopy.  Agreeable for Cologuard  Follow-up 1 year Home blood pressure monitoring encouraged Modest weight loss recommended  Marletta Lor

## 2017-11-14 NOTE — Patient Instructions (Addendum)
Limit your sodium (Salt) intake  Please check your blood pressure on a regular basis.  If it is consistently greater than 140/90, please make an office appointment.  Return in one year for follow-up  Cologuard testing as discussed   Health Maintenance, Male A healthy lifestyle and preventive care is important for your health and wellness. Ask your health care provider about what schedule of regular examinations is right for you. What should I know about weight and diet? Eat a Healthy Diet  Eat plenty of vegetables, fruits, whole grains, low-fat dairy products, and lean protein.  Do not eat a lot of foods high in solid fats, added sugars, or salt.  Maintain a Healthy Weight Regular exercise can help you achieve or maintain a healthy weight. You should:  Do at least 150 minutes of exercise each week. The exercise should increase your heart rate and make you sweat (moderate-intensity exercise).  Do strength-training exercises at least twice a week.  Watch Your Levels of Cholesterol and Blood Lipids  Have your blood tested for lipids and cholesterol every 5 years starting at 65 years of age. If you are at high risk for heart disease, you should start having your blood tested when you are 65 years old. You may need to have your cholesterol levels checked more often if: ? Your lipid or cholesterol levels are high. ? You are older than 65 years of age. ? You are at high risk for heart disease.  What should I know about cancer screening? Many types of cancers can be detected early and may often be prevented. Lung Cancer  You should be screened every year for lung cancer if: ? You are a current smoker who has smoked for at least 30 years. ? You are a former smoker who has quit within the past 15 years.  Talk to your health care provider about your screening options, when you should start screening, and how often you should be screened.  Colorectal Cancer  Routine colorectal cancer  screening usually begins at 65 years of age and should be repeated every 5-10 years until you are 65 years old. You may need to be screened more often if early forms of precancerous polyps or small growths are found. Your health care provider may recommend screening at an earlier age if you have risk factors for colon cancer.  Your health care provider may recommend using home test kits to check for hidden blood in the stool.  A small camera at the end of a tube can be used to examine your colon (sigmoidoscopy or colonoscopy). This checks for the earliest forms of colorectal cancer.  Prostate and Testicular Cancer  Depending on your age and overall health, your health care provider may do certain tests to screen for prostate and testicular cancer.  Talk to your health care provider about any symptoms or concerns you have about testicular or prostate cancer.  Skin Cancer  Check your skin from head to toe regularly.  Tell your health care provider about any new moles or changes in moles, especially if: ? There is a change in a mole's size, shape, or color. ? You have a mole that is larger than a pencil eraser.  Always use sunscreen. Apply sunscreen liberally and repeat throughout the day.  Protect yourself by wearing long sleeves, pants, a wide-brimmed hat, and sunglasses when outside.  What should I know about heart disease, diabetes, and high blood pressure?  If you are 54-21 years of age, have  your blood pressure checked every 3-5 years. If you are 33 years of age or older, have your blood pressure checked every year. You should have your blood pressure measured twice-once when you are at a hospital or clinic, and once when you are not at a hospital or clinic. Record the average of the two measurements. To check your blood pressure when you are not at a hospital or clinic, you can use: ? An automated blood pressure machine at a pharmacy. ? A home blood pressure monitor.  Talk to your  health care provider about your target blood pressure.  If you are between 38-62 years old, ask your health care provider if you should take aspirin to prevent heart disease.  Have regular diabetes screenings by checking your fasting blood sugar level. ? If you are at a normal weight and have a low risk for diabetes, have this test once every three years after the age of 15. ? If you are overweight and have a high risk for diabetes, consider being tested at a younger age or more often.  A one-time screening for abdominal aortic aneurysm (AAA) by ultrasound is recommended for men aged 6-75 years who are current or former smokers. What should I know about preventing infection? Hepatitis B If you have a higher risk for hepatitis B, you should be screened for this virus. Talk with your health care provider to find out if you are at risk for hepatitis B infection. Hepatitis C Blood testing is recommended for:  Everyone born from 1 through 1965.  Anyone with known risk factors for hepatitis C.  Sexually Transmitted Diseases (STDs)  You should be screened each year for STDs including gonorrhea and chlamydia if: ? You are sexually active and are younger than 65 years of age. ? You are older than 65 years of age and your health care provider tells you that you are at risk for this type of infection. ? Your sexual activity has changed since you were last screened and you are at an increased risk for chlamydia or gonorrhea. Ask your health care provider if you are at risk.  Talk with your health care provider about whether you are at high risk of being infected with HIV. Your health care provider may recommend a prescription medicine to help prevent HIV infection.  What else can I do?  Schedule regular health, dental, and eye exams.  Stay current with your vaccines (immunizations).  Do not use any tobacco products, such as cigarettes, chewing tobacco, and e-cigarettes. If you need help  quitting, ask your health care provider.  Limit alcohol intake to no more than 2 drinks per day. One drink equals 12 ounces of beer, 5 ounces of wine, or 1 ounces of hard liquor.  Do not use street drugs.  Do not share needles.  Ask your health care provider for help if you need support or information about quitting drugs.  Tell your health care provider if you often feel depressed.  Tell your health care provider if you have ever been abused or do not feel safe at home. This information is not intended to replace advice given to you by your health care provider. Make sure you discuss any questions you have with your health care provider. Document Released: 09/09/2007 Document Revised: 11/10/2015 Document Reviewed: 12/15/2014 Elsevier Interactive Patient Education  [employer].

## 2017-11-15 LAB — HEPATITIS C ANTIBODY
HEP C AB: NONREACTIVE
SIGNAL TO CUT-OFF: 0.3 (ref <1.00)

## 2018-01-18 ENCOUNTER — Telehealth: Payer: Self-pay

## 2018-01-18 NOTE — Telephone Encounter (Signed)
Received a fax from [employer]. Pt had not return his cologuard. Spoke to pt wife and she stated that the kit was sent to the wrong address. When pt finally received the kit it didn't have any instructions. When pt called to get intructions from the company, they stated that they cannot give pt the instructions( per pt wife).

## 2019-02-17 ENCOUNTER — Telehealth: Payer: Self-pay | Admitting: Internal Medicine

## 2019-02-17 NOTE — Telephone Encounter (Signed)
Forwarding to [place]. Pt has not re-established a PCP.

## 2019-02-17 NOTE — Telephone Encounter (Signed)
Pt needs to re establish a PCP

## 2019-02-17 NOTE — Telephone Encounter (Signed)
Medication Refill - Medication:  simvastatin (ZOCOR) 20 MG tablet lisinopril-hydrochlorothiazide (PRINZIDE,ZESTORETIC) 10-12.5 MG tablet  Has the patient contacted their pharmacy? Yes advised to call office as they have sent requests for this with no response.  Preferred Pharmacy (with phone number or street name):  [id number] - [place], [place] - [address] [phone] (Phone) [phone] (Fax)     Agent: Please be advised that RX refills may take up to 3 business days. We ask that you follow-up with your pharmacy.

## 2019-03-04 NOTE — Telephone Encounter (Signed)
Per team leader sending rx request back to Daniel Robinson. A refusal of refill needs a cosigner from a provider.

## 2019-03-04 NOTE — Telephone Encounter (Signed)
Noted this prescription request. Pt needs a new provider, former provider retired. Pt has not been seen in office since August 2019.  Called and LM on VM to call back to establish care with either Dr [name], Jerilee Hoh or [place].

## 2019-03-05 NOTE — Telephone Encounter (Signed)
See note. Please call and help with getting patient established with another provider.

## 2019-03-05 NOTE — Telephone Encounter (Signed)
Patient scheduled for TOC/medication refills with Dr. [name] on 03/10/2019.

## 2019-03-10 ENCOUNTER — Telehealth: Payer: Self-pay | Admitting: Family Medicine

## 2019-03-11 ENCOUNTER — Telehealth (INDEPENDENT_AMBULATORY_CARE_PROVIDER_SITE_OTHER): Payer: Medicare Other | Admitting: Family Medicine

## 2019-03-11 ENCOUNTER — Encounter: Payer: Self-pay | Admitting: Family Medicine

## 2019-03-11 VITALS — Ht 69.0 in | Wt 175.0 lb

## 2019-03-11 DIAGNOSIS — I10 Essential (primary) hypertension: Secondary | ICD-10-CM | POA: Diagnosis not present

## 2019-03-11 DIAGNOSIS — E663 Overweight: Secondary | ICD-10-CM | POA: Diagnosis not present

## 2019-03-11 DIAGNOSIS — E782 Mixed hyperlipidemia: Secondary | ICD-10-CM

## 2019-03-11 MED ORDER — LISINOPRIL-HYDROCHLOROTHIAZIDE 10-12.5 MG PO TABS: 1.0000 | ORAL_TABLET | Freq: Every day | ORAL | 2 refills | Status: DC

## 2019-03-11 MED ORDER — SIMVASTATIN 20 MG PO TABS: ORAL_TABLET | ORAL | 3 refills | Status: DC

## 2019-03-11 NOTE — Progress Notes (Signed)
Virtual Visit via Video Note   I connected with Daniel Robinson on 03/11/19 by a video enabled telemedicine application and verified that I am speaking with the correct person using two identifiers.  Location patient: home Location provider:work office Persons participating in the virtual visit: patient, provider  I discussed the limitations of evaluation and management by telemedicine and the availability of in person appointments. The patient expressed understanding and agreed to proceed.   HPI: Daniel. Daniel Robinson is a 68 yo male with Hx of HTN ,lumbar DDD,and HLD who is establishing care today. Former PCP: Dr. Burnice Logan. Last CPE in 08/2017.  Hx of obesity, BMI 30. Since his last visit he has lost wt , changed his diet , last meal around 3-4 pm. Current wt is 175 lb. He is not exercising regularly. No hx of tobacco use.  HTN: Dx'ed about 8 years ago. Negative for severe/frequent headache, dizziness, visual changes, chest pain, dyspnea, palpitation, claudication, abdominal pain, N/V, urinary symptoms, focal weakness, or edema.  Lab Results  Component Value Date   CREATININE 0.99 11/14/2017   BUN 20 11/14/2017   NA 139 11/14/2017   K 4.1 11/14/2017   CL 101 11/14/2017   CO2 29 11/14/2017   HLD: Currently he is on Simvastatin 20 mg daily. Tolerating medication well.  Lab Results  Component Value Date   CHOL 220 (H) 11/14/2017   HDL 40.20 11/14/2017   LDLCALC 101 (H) 12/16/2012   LDLDIRECT 119.0 11/14/2017   TRIG 334.0 (H) 11/14/2017   CHOLHDL 5 11/14/2017    ROS: See pertinent positives and negatives per HPI.  Past Medical History:  Diagnosis Date  . Hyperlipidemia   . Hypertension     Past Surgical History:  Procedure Laterality Date  . BACK SURGERY    . SHOULDER SURGERY      Family History  Problem Relation Age of Onset  . Hyperlipidemia Mother   . Hypertension Mother     Social History   Socioeconomic History  . Marital status: Married    Spouse  name: Not on file  . Number of children: Not on file  . Years of education: Not on file  . Highest education level: Not on file  Occupational History  . Not on file  Tobacco Use  . Smoking status: Never Smoker  . Smokeless tobacco: Never Used  Substance and Sexual Activity  . Alcohol use: Yes  . Drug use: No  . Sexual activity: Not on file  Other Topics Concern  . Not on file  Social History Narrative  . Not on file   Social Determinants of Health   Financial Resource Strain:   . Difficulty of Paying Living Expenses: Not on file  Food Insecurity:   . Worried About Charity fundraiser in the Last Year: Not on file  . Ran Out of Food in the Last Year: Not on file  Transportation Needs:   . Lack of Transportation (Medical): Not on file  . Lack of Transportation (Non-Medical): Not on file  Physical Activity:   . Days of Exercise per Week: Not on file  . Minutes of Exercise per Session: Not on file  Stress:   . Feeling of Stress : Not on file  Social Connections:   . Frequency of Communication with Friends and Family: Not on file  . Frequency of Social Gatherings with Friends and Family: Not on file  . Attends Religious Services: Not on file  . Active Member of Clubs or Organizations: Not on  file  . Attends Archivist Meetings: Not on file  . Marital Status: Not on file  Intimate Partner Violence:   . Fear of Current or Ex-Partner: Not on file  . Emotionally Abused: Not on file  . Physically Abused: Not on file  . Sexually Abused: Not on file    Current Outpatient Medications:  .  lisinopril-hydrochlorothiazide (ZESTORETIC) 10-12.5 MG tablet, Take 1 tablet by mouth daily., Disp: 90 tablet, Rfl: 2 .  simvastatin (ZOCOR) 20 MG tablet, TAKE 1 TABLET (20 MG TOTAL) BY MOUTH AT BEDTIME., Disp: 90 tablet, Rfl: 3  EXAM: VITALS per patient if applicable:Ht 5\' 9"  (1.753 m)   Wt 175 lb (79.4 kg)   BMI 25.84 kg/m   GENERAL: alert, oriented, appears well and in no  acute distress  HEENT: atraumatic, conjunctiva clear, no obvious abnormalities on inspection.  NECK: normal movements of the head and neck  LUNGS: on inspection no signs of respiratory distress, breathing rate appears normal, no obvious gross SOB, gasping or wheezing  CV: no obvious cyanosis  MS: moves all visible extremities without noticeable abnormality  PSYCH/NEURO: pleasant and cooperative, no obvious depression or anxiety, speech and thought processing grossly intact  ASSESSMENT AND PLAN:  Discussed the following assessment and plan: Orders Placed This Encounter  Procedures  . CMP  . Lipid panel    Essential hypertension - Plan: CMP, lisinopril-hydrochlorothiazide (ZESTORETIC) 10-12.5 MG tablet Recommend monitoring BP regularly. Possible complications of elevated BP discussed. No changes in current management. Lab appointment arranged.  Mixed hyperlipidemia - Plan: Lipid panel, simvastatin (ZOCOR) 20 MG tablet Continue low-fat diet and simvastatin 20 mg daily. Further recommendation will be given according to lab results.  Overweight (BMI 25.0-29.9) BMI went from 30-25, obesity resolved. Encouraged to continue following a healthful diet. He also would benefit from engaging in regular physical activity.  Health maintenance: he prefers to hold on colon cancer screening for now. Pneumovax and influenza vaccine next week when he comes for labs.   I discussed the assessment and treatment plan with the patient. He was provided an opportunity to ask questions and all were answered. He agreed with the plan and demonstrated an understanding of the instructions.  Return in about 6 months (around 09/09/2019) for cpe.    Chamar Broughton [name], MD

## 2019-03-12 NOTE — Progress Notes (Signed)
Patient scheduled 09/09/2019 at 8:30

## 2019-03-19 ENCOUNTER — Other Ambulatory Visit: Payer: Self-pay

## 2019-03-20 ENCOUNTER — Other Ambulatory Visit (INDEPENDENT_AMBULATORY_CARE_PROVIDER_SITE_OTHER): Payer: Medicare Other

## 2019-03-20 DIAGNOSIS — E782 Mixed hyperlipidemia: Secondary | ICD-10-CM | POA: Diagnosis not present

## 2019-03-20 DIAGNOSIS — I10 Essential (primary) hypertension: Secondary | ICD-10-CM | POA: Diagnosis not present

## 2019-03-20 LAB — LIPID PANEL
Cholesterol: 218 mg/dL — ABNORMAL HIGH (ref 0–200)
HDL: 42.3 mg/dL (ref >39.00)
LDL Cholesterol: 144 mg/dL — ABNORMAL HIGH (ref 0–99)
NonHDL: 175.61
Total CHOL/HDL Ratio: 5
Triglycerides: 156 mg/dL — ABNORMAL HIGH (ref 0.0–149.0)
VLDL: 31.2 mg/dL (ref 0.0–40.0)

## 2019-03-20 LAB — COMPREHENSIVE METABOLIC PANEL
ALT: 11 U/L (ref 0–53)
AST: 13 U/L (ref 0–37)
Albumin: 4.1 g/dL (ref 3.5–5.2)
Alkaline Phosphatase: 33 U/L — ABNORMAL LOW (ref 39–117)
BUN: 18 mg/dL (ref 6–23)
CO2: 28 mEq/L (ref 19–32)
Calcium: 9 mg/dL (ref 8.4–10.5)
Chloride: 108 mEq/L (ref 96–112)
Creatinine, Ser: 0.78 mg/dL (ref 0.40–1.50)
GFR: 99.51 mL/min (ref >60.00)
Glucose, Bld: 81 mg/dL (ref 70–99)
Potassium: 3.9 mEq/L (ref 3.5–5.1)
Sodium: 143 mEq/L (ref 135–145)
Total Bilirubin: 0.6 mg/dL (ref 0.2–1.2)
Total Protein: 6.3 g/dL (ref 6.0–8.3)

## 2019-09-08 ENCOUNTER — Other Ambulatory Visit: Payer: Self-pay

## 2019-09-09 ENCOUNTER — Encounter: Payer: Self-pay | Admitting: Family Medicine

## 2019-09-09 ENCOUNTER — Ambulatory Visit (INDEPENDENT_AMBULATORY_CARE_PROVIDER_SITE_OTHER): Payer: Medicare Other | Admitting: Family Medicine

## 2019-09-09 VITALS — BP 126/82 | HR 75 | Temp 97.6°F | Resp 12 | Ht 69.0 in | Wt 187.0 lb

## 2019-09-09 DIAGNOSIS — Z13228 Encounter for screening for other metabolic disorders: Secondary | ICD-10-CM

## 2019-09-09 DIAGNOSIS — Z0000 Encounter for general adult medical examination without abnormal findings: Secondary | ICD-10-CM | POA: Diagnosis not present

## 2019-09-09 DIAGNOSIS — Z23 Encounter for immunization: Secondary | ICD-10-CM | POA: Diagnosis not present

## 2019-09-09 DIAGNOSIS — I10 Essential (primary) hypertension: Secondary | ICD-10-CM

## 2019-09-09 DIAGNOSIS — Z130 Encounter for screening for diseases of the blood and blood-forming organs and certain disorders involving the immune mechanism: Secondary | ICD-10-CM

## 2019-09-09 DIAGNOSIS — Z1211 Encounter for screening for malignant neoplasm of colon: Secondary | ICD-10-CM | POA: Diagnosis not present

## 2019-09-09 DIAGNOSIS — E782 Mixed hyperlipidemia: Secondary | ICD-10-CM

## 2019-09-09 DIAGNOSIS — Z1329 Encounter for screening for other suspected endocrine disorder: Secondary | ICD-10-CM | POA: Diagnosis not present

## 2019-09-09 MED ORDER — SIMVASTATIN 20 MG PO TABS: ORAL_TABLET | ORAL | 3 refills | Status: DC

## 2019-09-09 MED ORDER — LISINOPRIL-HYDROCHLOROTHIAZIDE 10-12.5 MG PO TABS: 1.0000 | ORAL_TABLET | Freq: Every day | ORAL | 2 refills | Status: DC

## 2019-09-09 NOTE — Progress Notes (Signed)
HPI:  Daniel Robinson is a 69 y.o.male here today for his routine physical examination.  Last CPE: 11/14/17 He lives with wife and sons (47,7,and 67 years old).  Regular exercise 3 or more times per week: Not structured exercise program but he is very active, walks a few times per week. Following a healthy diet: Yes.   Chronic medical problems: Hypertension and hyperlipidemia.  HTN: He is on Lisinopril-HCTZ 10-12.5 mg daily. HLD: Taking Simvastatin 20 mg daily.  Tolerating medications well.  Immunization History  Administered Date(s) Administered  . Influenza,inj,Quad PF,6+ Mos 12/20/2012, 01/13/2015  . Pneumococcal Polysaccharide-23 09/09/2019  . Tdap 12/20/2012   -Hep C screening: 11/14/17 NR. Last colon cancer screening: Never. Last prostate ca screening: PSA 1.9 in 10/2017.  -Denies high alcohol intake, tobacco use, or Hx of illicit drug use. He drinks a beer once in a while.  Review of Systems  Constitutional: Negative for activity change, appetite change, fatigue and fever.  HENT: Negative for dental problem, nosebleeds, sore throat and trouble swallowing.   Eyes: Negative for redness and visual disturbance.  Respiratory: Negative for cough, shortness of breath and wheezing.   Cardiovascular: Negative for chest pain, palpitations and leg swelling.  Gastrointestinal: Negative for abdominal pain, blood in stool, nausea and vomiting.  Endocrine: Negative for cold intolerance, heat intolerance, polydipsia, polyphagia and polyuria.  Genitourinary: Negative for decreased urine volume, dysuria, genital sores, hematuria and testicular pain.  Musculoskeletal: Positive for arthralgias and back pain. Negative for gait problem.  Skin: Negative for color change and rash.  Allergic/Immunologic: Negative for environmental allergies.  Neurological: Negative for syncope, weakness and headaches.  Hematological: Negative for adenopathy. Does not bruise/bleed easily.    Psychiatric/Behavioral: Negative for confusion and sleep disturbance. The patient is not nervous/anxious.   All other systems reviewed and are negative.  No current outpatient medications on file prior to visit.   No current facility-administered medications on file prior to visit.   Past Medical History:  Diagnosis Date  . Hyperlipidemia   . Hypertension    Past Surgical History:  Procedure Laterality Date  . BACK SURGERY    . SHOULDER SURGERY     No Known Allergies  Family History  Problem Relation Age of Onset  . Hyperlipidemia Mother   . Hypertension Mother     Social History   Socioeconomic History  . Marital status: Married    Spouse name: Not on file  . Number of children: Not on file  . Years of education: Not on file  . Highest education level: Not on file  Occupational History  . Not on file  Tobacco Use  . Smoking status: Never Smoker  . Smokeless tobacco: Never Used  Substance and Sexual Activity  . Alcohol use: Yes  . Drug use: No  . Sexual activity: Not on file  Other Topics Concern  . Not on file  Social History Narrative  . Not on file   Social Determinants of Health   Financial Resource Strain:   . Difficulty of Paying Living Expenses:   Food Insecurity:   . Worried About Charity fundraiser in the Last Year:   . Arboriculturist in the Last Year:   Transportation Needs:   . Film/video editor (Medical):   Marland Kitchen Lack of Transportation (Non-Medical):   Physical Activity:   . Days of Exercise per Week:   . Minutes of Exercise per Session:   Stress:   . Feeling of  Stress :   Social Connections:   . Frequency of Communication with Friends and Family:   . Frequency of Social Gatherings with Friends and Family:   . Attends Religious Services:   . Active Member of Clubs or Organizations:   . Attends Archivist Meetings:   Marland Kitchen Marital Status:    Vitals:   09/09/19 0817  BP: 126/82  Pulse: 75  Resp: 12  Temp: 97.6 F (36.4  C)  SpO2: 97%   Body mass index is 27.62 kg/m.   Wt Readings from Last 3 Encounters:  09/09/19 187 lb (84.8 kg)  03/11/19 175 lb (79.4 kg)  11/14/17 203 lb 6.4 oz (92.3 kg)   Physical Exam  Nursing note and vitals reviewed. Constitutional: He is oriented to person, place, and time. He appears well-developed. No distress.  HENT:  Head: Atraumatic.  Right Ear: Tympanic membrane, external ear and ear canal normal.  Left Ear: Tympanic membrane, external ear and ear canal normal.  Mouth/Throat: Mucous membranes are moist.  Eyes: Pupils are equal, round, and reactive to light. Conjunctivae are normal.    At 3 O'clock of iris an oval hyperpigmented lesion.   Neck: No tracheal deviation present. No thyromegaly present.  Cardiovascular: Normal rate and regular rhythm.  No murmur heard. Pulses:      Dorsalis pedis pulses are 2+ on the right side and 2+ on the left side.  Respiratory: Effort normal and breath sounds normal. No respiratory distress.  GI: Soft. Normal appearance. He exhibits no mass. There is no hepatomegaly. There is no abdominal tenderness.  Genitourinary:    Genitourinary Comments: No concerns.   Musculoskeletal:        General: No tenderness.     Cervical back: Normal range of motion.     Comments: No major deformities appreciated and no signs of synovitis.  Lymphadenopathy:    He has no cervical adenopathy.       Right: No supraclavicular adenopathy present.       Left: No supraclavicular adenopathy present.  Neurological: He is alert and oriented to person, place, and time. No cranial nerve deficit or sensory deficit. Coordination and gait normal.  Reflex Scores:      Bicep reflexes are 2+ on the right side and 2+ on the left side.      Patellar reflexes are 2+ on the right side and 2+ on the left side. Skin: Skin is warm. No erythema.  Psychiatric: His behavior is normal. Mood and affect normal.    ASSESSMENT AND PLAN: DanielDaniel Robinson was seen today for annual  exam.  Diagnoses and all orders for this visit:  Orders Placed This Encounter  Procedures  . Pneumococcal polysaccharide vaccine 23-valent greater than or equal to 2yo subcutaneous/IM  . Cologuard    Routine general medical examination at a health care facility We discussed the importance of regular physical activity and healthy diet for prevention of chronic illness and/or complications. Preventive guidelines reviewed. Vaccination updated.  Next CPE in a year.  Colon cancer screening -     Cologuard  Screening for endocrine, metabolic and immunity disorder  Mixed hyperlipidemia No changes in current management. Will re-check [place] 02/2020.  -     simvastatin (ZOCOR) 20 MG tablet; TAKE 1 TABLET (20 MG TOTAL) BY MOUTH AT BEDTIME.  Essential hypertension BP adequately controlled. No changes in current management.  -     lisinopril-hydrochlorothiazide (ZESTORETIC) 10-12.5 MG tablet; Take 1 tablet by mouth daily.  Need for pneumococcal vaccination -  Pneumococcal polysaccharide vaccine 23-valent greater than or equal to 2yo subcutaneous/IM  Return in 6 months (on 03/10/2020).  Jenisa Monty G. [name], MD  [facility]. [place] office.

## 2019-09-09 NOTE — Patient Instructions (Addendum)
A few things to remember from today's visit:   Routine general medical examination at a health care facility  Colon cancer screening - Plan: Cologuard  Screening for endocrine, metabolic and immunity disorder  Mixed hyperlipidemia - Plan: simvastatin (ZOCOR) 20 MG tablet  Essential hypertension - Plan: lisinopril-hydrochlorothiazide (ZESTORETIC) 10-12.5 MG tablet  If you need refills please call your pharmacy. Do not use My Chart to request refills or for acute issues that need immediate attention.   At least 150 minutes of moderate exercise per week, daily brisk walking for 15-30 min is a good exercise option. Healthy diet low in saturated (animal) fats and sweets and consisting of fresh fruits and vegetables, lean meats such as fish and white chicken and whole grains.  - Vaccines:  Tdap vaccine every 10 years.  Shingles vaccine recommended at age 67, could be given after 67 years of age but not sure about insurance coverage.  Pneumonia vaccines: Pneumovax at 109  -Screening recommendations for low/normal risk males:  Screening for diabetes at age 1 and every 3 years. Earlier screening if cardiovascular risk factors.   Lipid screening at 35 and every 3 years. Screening starts in younger males with cardiovascular risk factors.N/A  Colon cancer screening is now at age 43 but your insurance may not cover until age 5 .screening is recommended age 9.  Prostate cancer screening: some controversy, starts usually at 6: Rectal exam and PSA.  Aortic Abdominal Aneurism once between 74 and 44 years old if ever smoker.  Also recommended:  1. Dental visit- Brush and floss your teeth twice daily; visit your dentist twice a year. 2. Eye doctor- Get an eye exam at least every 2 years. 3. Helmet use- Always wear a helmet when riding a bicycle, motorcycle, rollerblading or skateboarding. 4. Safe sex- If you may be exposed to sexually transmitted infections, use a condom. 5. Seat  belts- Seat belts can save your live; always wear one. 6. Smoke/Carbon Monoxide detectors- These detectors need to be installed on the appropriate level of your home. Replace batteries at least once a year. 7. Skin cancer- When out in the sun please cover up and use sunscreen 15 SPF or higher. 8. Violence- If anyone is threatening or hurting you, please tell your healthcare provider.  9. Drink alcohol in moderation- Limit alcohol intake to one drink or less per day. Never drink and drive.   Please be sure medication list is accurate. If a new problem present, please set up appointment sooner than planned today.

## 2020-05-19 NOTE — Telephone Encounter (Signed)
Please sign off

## 2020-10-15 ENCOUNTER — Other Ambulatory Visit: Payer: Self-pay | Admitting: Family Medicine

## 2020-10-15 DIAGNOSIS — E782 Mixed hyperlipidemia: Secondary | ICD-10-CM

## 2020-11-19 ENCOUNTER — Other Ambulatory Visit: Payer: Self-pay | Admitting: Family Medicine

## 2020-11-19 DIAGNOSIS — E782 Mixed hyperlipidemia: Secondary | ICD-10-CM

## 2021-01-25 DIAGNOSIS — I7103 Dissection of thoracoabdominal aorta: Secondary | ICD-10-CM

## 2021-01-25 HISTORY — DX: Dissection of thoracoabdominal aorta: I71.03

## 2021-02-10 HISTORY — PX: REPAIR OF ACUTE ASCENDING THORACIC AORTIC DISSECTION: SHX6323

## 2021-02-15 ENCOUNTER — Telehealth: Payer: Self-pay

## 2021-02-15 DIAGNOSIS — I7101 Dissection of ascending aorta: Secondary | ICD-10-CM

## 2021-02-15 DIAGNOSIS — Z95828 Presence of other vascular implants and grafts: Secondary | ICD-10-CM

## 2021-02-15 NOTE — Addendum Note (Signed)
Addended by: Rodrigo Ran on: 02/15/2021 04:34 PM   Modules accepted: Orders

## 2021-02-15 NOTE — Telephone Encounter (Signed)
Wife of patient called stating patient is in the hospital in [place] and needs referral for therapy to be placed in [place] and would like a call back to discuss

## 2021-02-15 NOTE — Telephone Encounter (Signed)
I called and spoke with patient and his wife. They are needing to get cardiac rehab set up here for when they return home in a week or so. I have placed the referral and listed the wife's number for them to call to set up the rehab.

## 2021-02-16 NOTE — Telephone Encounter (Signed)
Can you set up a hospital follow up for when they return to [place]? He'll have to be seen by [place] before Rehab can take over - okay to use a virtual slot if needed.  Wife's number is [phone].

## 2021-02-21 ENCOUNTER — Ambulatory Visit (INDEPENDENT_AMBULATORY_CARE_PROVIDER_SITE_OTHER): Payer: Medicare Other | Admitting: Family Medicine

## 2021-02-21 ENCOUNTER — Encounter: Payer: Self-pay | Admitting: Family Medicine

## 2021-02-21 VITALS — BP 132/68 | HR 75 | Temp 98.5°F | Resp 16 | Ht 69.0 in | Wt 184.8 lb

## 2021-02-21 DIAGNOSIS — Z95828 Presence of other vascular implants and grafts: Secondary | ICD-10-CM | POA: Diagnosis not present

## 2021-02-21 DIAGNOSIS — D62 Acute posthemorrhagic anemia: Secondary | ICD-10-CM | POA: Diagnosis not present

## 2021-02-21 DIAGNOSIS — E876 Hypokalemia: Secondary | ICD-10-CM | POA: Diagnosis not present

## 2021-02-21 DIAGNOSIS — R059 Cough, unspecified: Secondary | ICD-10-CM

## 2021-02-21 DIAGNOSIS — I4891 Unspecified atrial fibrillation: Secondary | ICD-10-CM

## 2021-02-21 DIAGNOSIS — I10 Essential (primary) hypertension: Secondary | ICD-10-CM

## 2021-02-21 DIAGNOSIS — I9789 Other postprocedural complications and disorders of the circulatory system, not elsewhere classified: Secondary | ICD-10-CM

## 2021-02-21 DIAGNOSIS — N289 Disorder of kidney and ureter, unspecified: Secondary | ICD-10-CM

## 2021-02-21 LAB — CBC WITH DIFFERENTIAL/PLATELET
Basophils Absolute: 0.1 10*3/uL (ref 0.0–0.1)
Basophils Relative: 0.4 % (ref 0.0–3.0)
Eosinophils Absolute: 0.3 10*3/uL (ref 0.0–0.7)
Eosinophils Relative: 2 % (ref 0.0–5.0)
HCT: 31.9 % — ABNORMAL LOW (ref 39.0–52.0)
Hemoglobin: 10.6 g/dL — ABNORMAL LOW (ref 13.0–17.0)
Lymphocytes Relative: 9.5 % — ABNORMAL LOW (ref 12.0–46.0)
Lymphs Abs: 1.2 10*3/uL (ref 0.7–4.0)
MCHC: 33.3 g/dL (ref 30.0–36.0)
MCV: 94.2 fl (ref 78.0–100.0)
Monocytes Absolute: 1.2 10*3/uL — ABNORMAL HIGH (ref 0.1–1.0)
Monocytes Relative: 9.6 % (ref 3.0–12.0)
Neutro Abs: 10.1 10*3/uL — ABNORMAL HIGH (ref 1.4–7.7)
Neutrophils Relative %: 78.5 % — ABNORMAL HIGH (ref 43.0–77.0)
Platelets: 451 10*3/uL — ABNORMAL HIGH (ref 150.0–400.0)
RBC: 3.39 Mil/uL — ABNORMAL LOW (ref 4.22–5.81)
RDW: 15.1 % (ref 11.5–15.5)
WBC: 12.9 10*3/uL — ABNORMAL HIGH (ref 4.0–10.5)

## 2021-02-21 LAB — COMPREHENSIVE METABOLIC PANEL
ALT: 37 U/L (ref 0–53)
AST: 20 U/L (ref 0–37)
Albumin: 3.5 g/dL (ref 3.5–5.2)
Alkaline Phosphatase: 56 U/L (ref 39–117)
BUN: 11 mg/dL (ref 6–23)
CO2: 29 mEq/L (ref 19–32)
Calcium: 8.5 mg/dL (ref 8.4–10.5)
Chloride: 104 mEq/L (ref 96–112)
Creatinine, Ser: 0.8 mg/dL (ref 0.40–1.50)
GFR: 91.14 mL/min (ref >60.00)
Glucose, Bld: 82 mg/dL (ref 70–99)
Potassium: 4 mEq/L (ref 3.5–5.1)
Sodium: 140 mEq/L (ref 135–145)
Total Bilirubin: 0.4 mg/dL (ref 0.2–1.2)
Total Protein: 6.3 g/dL (ref 6.0–8.3)

## 2021-02-21 MED ORDER — OLMESARTAN MEDOXOMIL 20 MG PO TABS: 20.0000 mg | ORAL_TABLET | Freq: Every day | ORAL | 2 refills | Status: DC

## 2021-02-21 NOTE — Patient Instructions (Signed)
A few things to remember from today's visit:   S/P ascending aortic replacement - Plan: Ambulatory referral to Cardiothoracic Surgery, Ambulatory referral to Cardiology  Essential hypertension - Plan: Comprehensive metabolic panel  Cough, unspecified type  Hypokalemia - Plan: Comprehensive metabolic panel  Iron deficiency anemia, unspecified iron deficiency anemia type - Plan: CBC with Differential/Platelet  If you need refills please call your pharmacy. Do not use My Chart to request refills or for acute issues that need immediate attention.   Stop Lisinopril. Start Benicar 20 mg 1/2 tab daily. Monitor blood pressure and if blood pressure 140/90 or above in 7-10 days you can increase dose to 20 mg daily. Continue activity as tolerated and incentive spirometry.  Please be sure medication list is accurate. If a new problem present, please set up appointment sooner than planned today.

## 2021-02-21 NOTE — Progress Notes (Signed)
HPI: Daniel Robinson is a 70 y.o. male, who is here today with his wife to follow on recent hospitalization.  He presented to the [facility] with CP. He developed sudden onset of CP while hiking with his son, lying down in bed for a few minutes, which did not help.He decided to go to the ED because worsening pain.   According to records, pt was in extreme pain, hypotense. While initially IV access, [place] was performed, showed evidence of large pericardial effusion suggestive of pericardial tamponade secondary to dissection. He did code in the ED,endotracheal intubation was performed and underwent bedside pericardiocentesis, 30 cc of blood removed.  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 Also mediastinal hematoma surrounding and possibly compressing the main and right pulmonary arteries, and small left hemothorax.  Air transported to another facility, [facility], directly to the OR. 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  Flexeril 10 mg tid prn for muscle spasm.  TEE showed RVH but normal function, normal LV function (50-55%).  Episode of atrial fib, converted to NSR.Started on Amiodarone 200 mg daily and plan is to continue for a month. Treated for UTI during hospitalization. Negative for dysuria,increased urinary frequency, gross hematuria,or decreased urine output.  02/16/21: Color Urine Yellow, Light Yellow, Dark Yellow Yellow    Appearance Urine Clear Clear    Specific Gravity Urine 1.002 - 1.032 1.020    pH Urine 5.0 - 7.5 6.0    Protein Urine Negative Negative  mg/dL  Glucose Urine Negative Negative  mg/dL  Ketones Urine Negative Negative  mg/dL  Bilirubin Urine Negative Negative    Blood Urine Negative Negative    Nitrite Urine Negative Negative    Urobilinogen Urine <2.0 EU/dL 0.2    Leukocyte Esterase Urine  Negative Negative     24 hours of acute delirium, which cleared.  He was discharged home on 02/18/21.  Wound is healing well, no erythema or drainage. He has not noted fever,chills,or worsening fatigue. He is increasing activity as tolerated and appetite has improved.    Anemia: He received 3 cryo, 2 platelets intraoperative. Hg was 10.3 after blood transfusion.  White Blood Cell Count 4.0 - 11.0 10*9/L 13.3 High    Red Blood Cell Count 4.76 - 6.09 10*12/L 3.17 Low    Hemoglobin 14.3 - 18.1 g/dL 9.9 Low    Hematocrit 39.2 - 53.0 % 29.8 Low    Mean Corpuscular Volume 80.0 - 100.0 fL 94.0   Mean Corpuscular Hemoglobin 27.5 - 35.1 pg 31.2   Mean Corpuscular Hemoglobin Concentration 32.0 - 36.0 g/dL 33.2   Platelet Count 150 - 400 10*9/L 210   Red Cell Distribution Width CV 11.7 - 14.2 % 14.2   NRBC Percent 0 % 0.0   NRBC Absolute 0 10*9/L 0.00   HypoK+: He is on potassium chloride CR 10 meq 2 caps bid.  HTN: He is on Lisinopril 20 mg daily and carvedilol 25 mg bid. HCTZ was discontinued.  Negative for severe/frequent headache, visual changes,dyspnea, palpitation, focal weakness, or worsening edema. No orthopnea or PND. Furosemide 40 mg daily x 7 d to treat post op LE edema.  Sodium Serum/Plasma 137 - 145 mmol/L 133 Low     Potassium Serum/Plasma 3.5 - 5.5 mmol/L 3.9    Chloride Serum/Plasma 98 - 109 mmol/L 106    Carbon Dioxide 22 - 30 mmol/L 26  Glucose Serum/Plasma 74 - 100 mg/dL 84    Blood Urea Nitrogen 7 - 20 mg/dL 20    Creatinine Serum/Plasma 0.66 - 1.25 mg/dL 0.60 Low     Estimated GFR >=60 mL/min/1.73 "square meters" >90     Non productive cough for a while, attributed to Lisinopril. Supplemental O2 at night was recommended at the time of hospital discharge, he has not had it. Pulse ox at RA O2 95-96.  Chest wall tenderness when coughing. Negative for wheezing or dyspnea. + Burping.  Abdominal CTA on 02/10/21 also showed a right lung nodule and left renal  cyst. 9 mm left renal lesion demonstrating attenuation values greater than that of simple fluid. Differential considerations include a complex cyst or solid neoplasm. Further evaluation, beginning with renal sonography, is recommended on a nonemergent basis. 5. 7 x 4 mm (average diameter 5.5 mm) right lower lobe nodule.  No hx of tobacco use.  Review of Systems  Constitutional:  Positive for activity change and fatigue.  Eyes:  Negative for pain and visual disturbance.  Respiratory:  Positive for cough.   Gastrointestinal:  Negative for abdominal pain, nausea and vomiting.  Musculoskeletal:  Negative for gait problem.  Skin:  Negative for rash.  Neurological:  Negative for syncope, facial asymmetry and headaches.  Psychiatric/Behavioral:  Negative for confusion.   Rest see pertinent positives and negatives per HPI.  Current Outpatient Medications on File Prior to Visit  Medication Sig Dispense Refill   amiodarone (PACERONE) 200 MG tablet Take 200 mg by mouth daily.     aspirin 81 MG EC tablet Take 81 mg by mouth daily. Swallow whole.     carvedilol (COREG) 25 MG tablet Take 25 mg by mouth 2 (two) times daily with a meal.     cyclobenzaprine (FLEXERIL) 10 MG tablet Take 10 mg by mouth 3 (three) times daily as needed for muscle spasms.     potassium chloride (MICRO-K) 10 MEQ CR capsule Take 20 mEq by mouth 2 (two) times daily.     simvastatin (ZOCOR) 20 MG tablet TAKE 1 TABLET BY MOUTH EVERYDAY AT BEDTIME 30 tablet 0   No current facility-administered medications on file prior to visit.    Past Medical History:  Diagnosis Date   Hyperlipidemia    Hypertension    No Known Allergies  Social History   Socioeconomic History   Marital status: Married    Spouse name: Not on file   Number of children: Not on file   Years of education: Not on file   Highest education level: Not on file  Occupational History   Not on file  Tobacco Use   Smoking status: Never   Smokeless tobacco:  Never  Substance and Sexual Activity   Alcohol use: Yes   Drug use: No   Sexual activity: Not on file  Other Topics Concern   Not on file  Social History Narrative   Not on file   Social Determinants of Health   Financial Resource Strain: Not on file  Food Insecurity: Not on file  Transportation Needs: Not on file  Physical Activity: Not on file  Stress: Not on file  Social Connections: Not on file   Vitals:   02/21/21 1037  BP: 132/68  Pulse: 75  Resp: 16  Temp: 98.5 F (36.9 C)  SpO2: 98%   Body mass index is 27.29 kg/m.  Physical Exam Vitals and nursing note reviewed.  Constitutional:      General: He is not in  acute distress.    Appearance: He is well-developed.  HENT:     Head: Normocephalic and atraumatic.     Mouth/Throat:     Mouth: Mucous membranes are moist.     Pharynx: Oropharynx is clear.  Eyes:     Conjunctiva/sclera: Conjunctivae normal.  Cardiovascular:     Rate and Rhythm: Normal rate and regular rhythm.     Pulses:          Dorsalis pedis pulses are 2+ on the right side and 2+ on the left side.     Heart sounds: No murmur heard. Pulmonary:     Effort: Pulmonary effort is normal. No respiratory distress.     Breath sounds: Normal breath sounds.  Abdominal:     Palpations: Abdomen is soft. There is no hepatomegaly or mass.     Tenderness: There is no abdominal tenderness.  Lymphadenopathy:     Cervical: No cervical adenopathy.  Skin:    General: Skin is warm.     Findings: No rash.     Comments: Surgical wound with no erythema,edema,or drainage.   Neurological:     Mental Status: He is alert and oriented to person, place, and time.     Cranial Nerves: No cranial nerve deficit.     Gait: Gait normal.  Psychiatric:     Comments: Well groomed, good eye contact.   ASSESSMENT AND PLAN:  Mr.Seyed was seen today for hospitalization follow-up.  Diagnoses and all orders for this visit: Orders Placed This Encounter  Procedures    Comprehensive metabolic panel   CBC with Differential/Platelet   Ambulatory referral to Cardiothoracic Surgery   Ambulatory referral to Cardiology   Lab Results  Component Value Date   CREATININE 0.80 02/21/2021   BUN 11 02/21/2021   NA 140 02/21/2021   K 4.0 02/21/2021   CL 104 02/21/2021   CO2 29 02/21/2021   Lab Results  Component Value Date   WBC 12.9 (H) 02/21/2021   HGB 10.6 (L) 02/21/2021   HCT 31.9 (L) 02/21/2021   MCV 94.2 02/21/2021   PLT 451.0 (H) 02/21/2021   S/P ascending aortic replacement S/P Acute aortic dissection and cardiogenic shock. Wound is healing well. Monitor for signs of infection. Instructed to ambulating around his house and increase activity as tolerated. O2 sats have been normal , continue monitoring. Appt with cardiothoracic surgeon will be arranged. Instructed about warning signs.  Cough, unspecified type Reporting having problem for several months. We discussed possible etiologies. Stop Lisinopril. Monitor for changes.  Hypokalemia Continue KCL 10 meq 2 caps bid. Furosemide 40 mg to be discontinued in 4-5 days. Further recommendations according to BMP.  Acute blood loss anemia S/P blood transfusion. Further recommendations according to CBC results.  Postoperative atrial fibrillation (HCC) One time episode. Complete 30 days of Amiodarone 200 mg daily. Cardio referral place.  Hypertension BP adequately controlled. Lisinopril may be causing cough, so discontinue. Benicar 20 mg started today, recommend taking 1/2 tab initially and increase to the whole tab in 10 days if BP >= 140/90. Continue Carvedilol 25 mg bid. Low salt diet to continue.  Renal lesion We can arrange renal US in a few months and after follow up with cardiothoracic surgeon.  I spent a total of 46 minutes in both face to face and non face to face activities for this visit on the date of this encounter. During this time history was obtained and documented,  examination was performed, prior labs/imaging reviewed, and assessment/plan discussed.  Return  in about 4 months (around 06/21/2021).  Latrell Potempa G. [name], MD  [facility]. [place] office.

## 2021-02-21 NOTE — Assessment & Plan Note (Addendum)
BP adequately controlled. Lisinopril may be causing cough, so discontinue. Benicar 20 mg started today, recommend taking 1/2 tab initially and increase to the whole tab in 10 days if BP >= 140/90. Continue Carvedilol 25 mg bid. Low salt diet to continue.

## 2021-02-25 ENCOUNTER — Telehealth: Payer: Self-pay | Admitting: Family Medicine

## 2021-02-25 MED ORDER — POTASSIUM CHLORIDE ER 10 MEQ PO CPCR: 10.0000 meq | ORAL_CAPSULE | Freq: Two times a day (BID) | ORAL | 0 refills | Status: DC

## 2021-02-25 NOTE — Addendum Note (Signed)
Addended by: Rodrigo Ran on: 02/25/2021 08:25 AM   Modules accepted: Orders

## 2021-02-25 NOTE — Telephone Encounter (Signed)
Pt had an appt with dr [name] on 02-21-2021 for post hos fup. Pt does not have any more potassium chloride (MICRO-K) 10 MEQ CR capsule . Pt takes 2 capsules in morning and 2 capsules at night. Pt wife would like to know if he suppose to still take potassium capsules if so please send in new rx to  [id number] - [place], [place] - [address] Phone:  [phone]  Fax:  [phone]    Pt is out of potassium capsules

## 2021-02-25 NOTE — Telephone Encounter (Signed)
I spoke with pcp, okay to send in potassium for 1 cap bid until next blood work check. Pt has completed the furosemide. Patient's wife is aware that a new rx with different directions was sent in.

## 2021-02-28 ENCOUNTER — Telehealth: Payer: Self-pay

## 2021-02-28 NOTE — Telephone Encounter (Signed)
Pt called back with his wife to get clarification on the medication changes. Pt advised to stop the Benicar, hold on Carvedilol for tonight. Okay to resume at 1/2 tablet bid tomorrow if BP > 100/60. Pt & wife verbalized understanding.

## 2021-02-28 NOTE — Telephone Encounter (Signed)
-  Pt had surgery a little over 2 weeks ago and BP is 74/46, pt is tired and dizzy.  Disposition Reason: pt refused 911 but is going to ER, unsure which ER.  02/26/2021 12:54:07 PM Call EMS 911 Now Laqueta Due, RN, [employer] Disagrees  02/28/21 1224: Pt states he did not go to the ER. Pt states he had an episode today. States antihypertensive med was changed & pt was feeling dizziness after taking it. Pt states when the symptoms occurred he rested & pushed fluids. States today he took Carvedilol & Amiodarone, did not take Lisionpril & BP this am 80/64. Pt states he feels better at this time. Pt advised that he will need to make OV to discuss medication & side effects. Appt scheduled with PCP for 1st available 12/7. Pt also advised to hold all antihypertensives until PCP contacts him.

## 2021-02-28 NOTE — Telephone Encounter (Signed)
MD aware that pt has stopped Lisinopril as instructed & was taking Benicar. Pt instructions are the same. Pt notified of PCP instructions. Will also send to him via mychart as requested. Pt advised to keep appt on Weds 12/7 & share BP readings at that time. Pt verb understanding.

## 2021-02-28 NOTE — Telephone Encounter (Signed)
We recently changed his antihypertensive meds, we stopped Lisinopril and started Benicar. Discontinue Benicar and hold on Carvedilol today. Tomorrow he can resume Carvedilol, lower dose, 1/2 tab bid if BP > 100/60. For now no changes in Amiodarone for now. Continue monitoring BP and let us know about readings. Thanks, BJ

## 2021-03-02 ENCOUNTER — Ambulatory Visit (INDEPENDENT_AMBULATORY_CARE_PROVIDER_SITE_OTHER): Payer: Medicare Other | Admitting: Family Medicine

## 2021-03-02 ENCOUNTER — Encounter: Payer: Self-pay | Admitting: Family Medicine

## 2021-03-02 ENCOUNTER — Ambulatory Visit (INDEPENDENT_AMBULATORY_CARE_PROVIDER_SITE_OTHER): Payer: Medicare Other

## 2021-03-02 ENCOUNTER — Other Ambulatory Visit: Payer: Self-pay

## 2021-03-02 VITALS — BP 112/60 | HR 65 | Temp 97.8°F | Resp 16 | Ht 69.0 in | Wt 172.8 lb

## 2021-03-02 DIAGNOSIS — I10 Essential (primary) hypertension: Secondary | ICD-10-CM

## 2021-03-02 DIAGNOSIS — D62 Acute posthemorrhagic anemia: Secondary | ICD-10-CM

## 2021-03-02 DIAGNOSIS — E876 Hypokalemia: Secondary | ICD-10-CM

## 2021-03-02 DIAGNOSIS — R059 Cough, unspecified: Secondary | ICD-10-CM

## 2021-03-02 DIAGNOSIS — R0602 Shortness of breath: Secondary | ICD-10-CM

## 2021-03-02 DIAGNOSIS — Z95828 Presence of other vascular implants and grafts: Secondary | ICD-10-CM

## 2021-03-02 LAB — CBC
HCT: 35.2 % — ABNORMAL LOW (ref 39.0–52.0)
Hemoglobin: 11.6 g/dL — ABNORMAL LOW (ref 13.0–17.0)
MCHC: 32.9 g/dL (ref 30.0–36.0)
MCV: 93.3 fl (ref 78.0–100.0)
Platelets: 458 10*3/uL — ABNORMAL HIGH (ref 150.0–400.0)
RBC: 3.77 Mil/uL — ABNORMAL LOW (ref 4.22–5.81)
RDW: 14.1 % (ref 11.5–15.5)
WBC: 7.8 10*3/uL (ref 4.0–10.5)

## 2021-03-02 LAB — BASIC METABOLIC PANEL
BUN: 13 mg/dL (ref 6–23)
CO2: 28 mEq/L (ref 19–32)
Calcium: 9.2 mg/dL (ref 8.4–10.5)
Chloride: 105 mEq/L (ref 96–112)
Creatinine, Ser: 0.79 mg/dL (ref 0.40–1.50)
GFR: 91.47 mL/min (ref >60.00)
Glucose, Bld: 78 mg/dL (ref 70–99)
Potassium: 4.5 mEq/L (ref 3.5–5.1)
Sodium: 137 mEq/L (ref 135–145)

## 2021-03-02 LAB — POTASSIUM: Potassium: 4.5 mEq/L (ref 3.5–5.1)

## 2021-03-02 NOTE — Progress Notes (Signed)
Chief Complaint  Patient presents with   Hypotension   HPI: [name] is a 70 y.o. male, who is here today with his wife complaining of episodes of lightheadedness and hypotension.  He was last seen on 02/21/21 for hospitalization follow up, aortic dissection with hemopericardium and cardiogenic shock, s/p AVR,aortic replacement,ascending aorta replacement on 02/10/21.  Last visit Lisinopril 20 mg was discontinued because chronic non productive cough and Benicar 20 mg 1/2 tab was started. His wife noted improvement. A couple of days ago started with BP's 80's/60's. No changes in diet, no new OTC medications.  He discontinued Benicar and decreased dose of Carvedilol from 25 mg to 12.5 mg daily. Still having episodes of low BP after taking Carvedilol, 90's-low 100's/50-60's. Occasionally 120-130's/60-80's and one time 156/83. HR 60's-70's.  He has had some productive cough ,sometimes productive with "little" sputum,since aortic replacement surgery. Negative for CP and hemoptysis. He has not noted fever,chills,diaphoresis, abdominal pain,N/V,changes in bowel habits,or urinary symptoms.  His wife is concerned because he is still extremely fatigue and having some SOB.He feels like these are stable since hospital discharge.  HypoK+: He is not longer on Furosemide 40 mg, KCL was decreased from 40 meq to 20 meq daily.  Component     Latest Ref Rng & Units 02/21/2021          Sodium     135 - 145 mEq/L 140  Potassium     3.5 - 5.1 mEq/L 4.0  Chloride     96 - 112 mEq/L 104  CO2     19 - 32 mEq/L 29  Glucose     70 - 99 mg/dL 82  BUN     6 - 23 mg/dL 11  Creatinine     0.40 - 1.50 mg/dL 0.80   Anemia cause by acute blood loss.  Component     Latest Ref Rng & Units 02/21/2021  WBC     4.0 - 10.5 K/uL 12.9 (H)  RBC     4.22 - 5.81 Mil/uL 3.39 (L)  Hemoglobin     13.0 - 17.0 g/dL 10.6 (L)  HCT     39.0 - 52.0 % 31.9 (L)  MCV     78.0 - 100.0 fl 94.2   MCHC     30.0 - 36.0 g/dL 33.3  RDW     11.5 - 15.5 % 15.1  Platelets     150.0 - 400.0 K/uL 451.0 (H)  Neutrophils     43.0 - 77.0 % 78.5 (H)  Lymphocytes     12.0 - 46.0 % 9.5 (L)  Monocytes Relative     3.0 - 12.0 % 9.6  Eosinophil     0.0 - 5.0 % 2.0  Basophil     0.0 - 3.0 % 0.4  NEUT#     1.4 - 7.7 K/uL 10.1 (H)  Lymphocyte #     0.7 - 4.0 K/uL 1.2  Monocyte #     0.1 - 1.0 K/uL 1.2 (H)  Eosinophils Absolute     0.0 - 0.7 K/uL 0.3  Basophils Absolute     0.0 - 0.1 K/uL 0.1   He thinks nasal congestion if causing SOB, sometimes he feels like he cannot take a deep breath, feels better when his nose is not congested and he can breath through his nose. He is trying to eat 2-3 meals daily, feels full easily.  Feels better when burping.  Post op episode of atrial fib, he  is on Amiodarone 200 mg daily. Occasionally he feels some palpitations, better when he changes position. Sleeping on a recliner because he feels more comfortable. Negative for orthopnea,PND,and edema. He is doing incentive spirometry a few times per day.  He is having daily bowel movements, taking stool softener, negative for diarrhea.  Review of Systems  HENT:  Negative for mouth sores, nosebleeds and sore throat.   Respiratory:  Negative for wheezing and stridor.   Genitourinary:  Negative for decreased urine volume, dysuria and hematuria.  Skin:  Negative for rash.  Neurological:  Negative for syncope, weakness and headaches.  Hematological:  Negative for adenopathy. Does not bruise/bleed easily.  Psychiatric/Behavioral:  Negative for confusion. The patient is not nervous/anxious.   Rest see pertinent positives and negatives per HPI.  Current Outpatient Medications on File Prior to Visit  Medication Sig Dispense Refill   amiodarone (PACERONE) 200 MG tablet Take 200 mg by mouth daily.     aspirin 81 MG EC tablet Take 81 mg by mouth daily. Swallow whole.     cyclobenzaprine (FLEXERIL) 10 MG  tablet Take 10 mg by mouth 3 (three) times daily as needed for muscle spasms.     potassium chloride (MICRO-K) 10 MEQ CR capsule Take 1 capsule (10 mEq total) by mouth 2 (two) times daily. 60 capsule 0   simvastatin (ZOCOR) 20 MG tablet TAKE 1 TABLET BY MOUTH EVERYDAY AT BEDTIME (Patient not taking: Reported on 03/02/2021) 30 tablet 0   No current facility-administered medications on file prior to visit.   Past Medical History:  Diagnosis Date   Hyperlipidemia    Hypertension    No Known Allergies  Social History   Socioeconomic History   Marital status: Married    Spouse name: Not on file   Number of children: Not on file   Years of education: Not on file   Highest education level: Not on file  Occupational History   Not on file  Tobacco Use   Smoking status: Never   Smokeless tobacco: Never  Substance and Sexual Activity   Alcohol use: Yes   Drug use: No   Sexual activity: Not on file  Other Topics Concern   Not on file  Social History Narrative   Not on file   Social Determinants of Health   Financial Resource Strain: Not on file  Food Insecurity: Not on file  Transportation Needs: Not on file  Physical Activity: Not on file  Stress: Not on file  Social Connections: Not on file   Vitals:   03/02/21 0957  BP: 112/60  Pulse: 65  Resp: 16  Temp: 97.8 F (36.6 C)  SpO2: 100%   Body mass index is 25.52 kg/m.  Physical Exam Nursing note reviewed.  Constitutional:      General: He is not in acute distress.    Appearance: He is well-developed.  HENT:     Head: Normocephalic and atraumatic.  Eyes:     Conjunctiva/sclera: Conjunctivae normal.  Neck:     Vascular: No JVD.  Cardiovascular:     Rate and Rhythm: Normal rate and regular rhythm.     Heart sounds: No murmur heard.    Comments: PT pulses present. Pulmonary:     Effort: Pulmonary effort is normal. No respiratory distress.     Breath sounds: Normal breath sounds.  Abdominal:     Palpations:  Abdomen is soft. There is no hepatomegaly or mass.     Tenderness: There is no abdominal tenderness.  Lymphadenopathy:     Cervical: No cervical adenopathy.  Skin:    General: Skin is warm.     Coloration: Skin is pale.     Findings: No erythema or rash.  Neurological:     Mental Status: He is alert and oriented to person, place, and time.     Cranial Nerves: No cranial nerve deficit.     Gait: Gait normal.  Psychiatric:     Comments: Well groomed, good eye contact.   ASSESSMENT AND PLAN:  Mr.Jahkeem was seen today for hypotension.  Diagnoses and all orders for this visit: Orders Placed This Encounter  Procedures   DG Chest 2 View   Basic metabolic panel   Lab Results  Component Value Date   CREATININE 0.79 03/02/2021   BUN 13 03/02/2021   NA 137 03/02/2021   K 4.5 03/02/2021   K 4.5 03/02/2021   CL 105 03/02/2021   CO2 28 03/02/2021   Lab Results  Component Value Date   WBC 7.8 03/02/2021   HGB 11.6 (L) 03/02/2021   HCT 35.2 (L) 03/02/2021   MCV 93.3 03/02/2021   PLT 458.0 (H) 03/02/2021   SOB (shortness of breath) Fatigue vs SOB, reporting problem as stable after hospital discharge. We discussed possible etiologies. Lung auscultation negative,. CXR ordered, further recommendation will be given according to lab and imaging results. Clearly instructed about warning signs.  Primary hypertension Instructed to wean off Carvedilol in the next 5 days, if BP starts going up, it can be resumed. Continue low salt diet. Continue monitoring BP regularly.  Cough, unspecified type Chronic cough greatly improved after discontinuation of Lisinopril. Mildly productive cough after cardiothoracic surgery, explained that it is not uncommon to have cough for a few weeks. Continue incentive spirometry. Monitor for fever or worsening symptoms. Advance physical activity as tolerated. Further recommendations according to CXR result.  Hypokalemia Continue KCL 10 meq  bid. Continue advancing diet as tolerated. Further commendations according to BMP results.  Return in about 4 weeks (around 03/30/2021).  Daniel Gerhart G. [name], MD  [facility]. [place] office.

## 2021-03-02 NOTE — Patient Instructions (Addendum)
A few things to remember from today's visit:   Primary hypertension  Acute blood loss anemia - Plan: CBC  SOB (shortness of breath)  If you need refills please call your pharmacy. Do not use My Chart to request refills or for acute issues that need immediate attention.   Start weaning off Carvedilol in the next 5 days, take 1/2 tab every other day and stop. Continue monitoring blood pressure. Adequate hydration.  Further recommendations according to lab results. Flonase nasal spray daily as needed. Nasal saline irrigation as needed.  Continue stool softener.  Please be sure medication list is accurate. If a new problem present, please set up appointment sooner than planned today.

## 2021-03-03 ENCOUNTER — Encounter: Payer: Self-pay | Admitting: Family Medicine

## 2021-03-03 NOTE — Progress Notes (Signed)
Cardiology Office Note:    Date:  03/04/2021   ID:  Daniel Robinson, DOB 01/18/1951, MRN [id number]  PCP:  [name], [name], MD   [name] Cardiologist:  Daniel Sciara, MD Referring MD: [name], [name], MD   Chief Complaint/Reason for Referral:  Post-operative atrial fibrillation, establish cardiovascular care  ASSESSMENT & PLAN:    History of ascending aortic replacement Will obtain echocardiogram for baseline and refer to cardiac rehabilitation.  Continue ASA 81. Follow up in 6 months or earlier if needed.  Atrial fibrillation, unspecified type ([place]) Post-operative atrial fibrillation was self-limited and the patient was in NSR at discharge from hospital in [place]. Patient is in normal sinus rhythm today.  Stop amiodarone and will place a 3-day monitor to evaluate for atrial fibrillation.  Primary hypertension Will stop carvedilol 12.5mg  every other day and start Toprol XL 12.5 mg at bedtime.  Mixed hyperlipidemia Restart simvastatin 20 mg and check lipid panel and LFTs in 3 months, LDL goal < 100.     Cardiac Rehabilitation Eligibility Assessment  The patient is ready to start cardiac rehabilitation from a cardiac standpoint.           Dispo:  No follow-ups on file.      Medication Adjustments/Labs and Tests Ordered: Current medicines are reviewed at length with the patient today.  Concerns regarding medicines are outlined above.    Tests Ordered: No orders of the defined types were placed in this encounter.    Medication Changes: No orders of the defined types were placed in this encounter.    History of Present Illness:    FOCUSED CARDIOVASCULAR PROBLEM LIST:   1.  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  Postoperative atrial fibrillation 3.   Hypertension 4.  Hyperlipidemia  The patient is a 70 y.o. male with the indicated medical history here for recommendations regarding atrial fibrillation management after cardiothoracic surgery.  The patient underwent emergent ascending aorta replacement in November when he was in [place].  He had transient atrial fibrillation after the surgery and was discharged on aspirin and amiodarone for 30 days; he was noted to be in NSR at discharge.       Previous Medical History: Past Medical History:  Diagnosis Date   Hyperlipidemia    Hypertension      Current Medications: Current Meds  Medication Sig   aspirin 81 MG EC tablet Take 81 mg by mouth daily. Swallow whole.   cyclobenzaprine (FLEXERIL) 10 MG tablet Take 10 mg by mouth 3 (three) times daily as needed for muscle spasms.   [DISCONTINUED] amiodarone (PACERONE) 200 MG tablet Take 200 mg by mouth daily.   [DISCONTINUED] carvedilol (COREG) 25 MG tablet Take 12.5 mg by mouth every other day.     Allergies:    Patient has no known allergies.   Social History:   Social History   Tobacco Use   Smoking status: Never   Smokeless tobacco: Never  Substance Use Topics   Alcohol use: Yes   Drug use: No     Family Hx: Family History  Problem Relation Age of Onset   Hyperlipidemia Mother    Hypertension Mother      Review of Systems:   Please see the history of present illness.    All other systems reviewed and are negative.  EKGs/Labs/Other  Test Reviewed:    EKG: Sinus rhythm with nonspecific T wave changes V3 through V6  Prior CV studies: None performed  Imaging studies that I have independently reviewed today: None performed  Recent Labs: 02/21/2021: ALT 37 03/02/2021: BUN 13; Creatinine, Ser 0.79; Hemoglobin 11.6; Platelets 458.0; Potassium 4.5; Potassium 4.5; Sodium 137   Recent Lipid Panel Lab Results  Component Value Date/Time   CHOL 218 (H) 03/20/2019 08:39 AM   TRIG 156.0 (H) 03/20/2019 08:39 AM   HDL  42.30 03/20/2019 08:39 AM   LDLCALC 144 (H) 03/20/2019 08:39 AM   LDLDIRECT 119.0 11/14/2017 09:25 AM     Risk Assessment/Calculations:           Physical Exam:    VS:  BP (!) 110/54   Pulse 83   Ht 5\' 9"  (1.753 m)   Wt 171 lb (77.6 kg)   SpO2 96%   BMI 25.25 kg/m    Wt Readings from Last 3 Encounters:  03/04/21 171 lb (77.6 kg)  03/02/21 172 lb 12.8 oz (78.4 kg)  02/21/21 184 lb 12.8 oz (83.8 kg)    GENERAL:  No apparent distress, AOx3 HEENT:  No carotid bruits, +2 carotid impulses, no scleral icterus CAR: RRR, no murmurs, gallops, rubs, or thrills; sternum well healed RES:  Clear to auscultation bilaterally ABD:  Soft, nontender, nondistended, positive bowel sounds x 4 VASC:  +2 radial pulses, +2 carotid pulses, palpable pedal pulses NEURO:  CN 2-12 grossly intact; motor and sensory grossly intact PSYCH:  No active depression or anxiety EXT:  No edema, ecchymosis, or cyanosis    Signed, Daniel Osmond, MD  03/04/2021 3:10 PM    [address], [place], [place]  [address] Phone: [phone]; Fax: [phone]

## 2021-03-04 ENCOUNTER — Ambulatory Visit (INDEPENDENT_AMBULATORY_CARE_PROVIDER_SITE_OTHER): Payer: Medicare Other | Admitting: Internal Medicine

## 2021-03-04 ENCOUNTER — Other Ambulatory Visit: Payer: Self-pay

## 2021-03-04 ENCOUNTER — Ambulatory Visit (INDEPENDENT_AMBULATORY_CARE_PROVIDER_SITE_OTHER): Payer: Medicare Other

## 2021-03-04 ENCOUNTER — Encounter: Payer: Self-pay | Admitting: Internal Medicine

## 2021-03-04 VITALS — BP 110/54 | HR 83 | Ht 69.0 in | Wt 171.0 lb

## 2021-03-04 DIAGNOSIS — I10 Essential (primary) hypertension: Secondary | ICD-10-CM

## 2021-03-04 DIAGNOSIS — E782 Mixed hyperlipidemia: Secondary | ICD-10-CM

## 2021-03-04 DIAGNOSIS — I4891 Unspecified atrial fibrillation: Secondary | ICD-10-CM

## 2021-03-04 DIAGNOSIS — Z95828 Presence of other vascular implants and grafts: Secondary | ICD-10-CM

## 2021-03-04 MED ORDER — METOPROLOL SUCCINATE ER 25 MG PO TB24: 12.5000 mg | ORAL_TABLET | Freq: Every day | ORAL | 3 refills | Status: DC

## 2021-03-04 MED ORDER — SIMVASTATIN 20 MG PO TABS: 20.0000 mg | ORAL_TABLET | Freq: Every day | ORAL | 3 refills | Status: DC

## 2021-03-04 NOTE — Patient Instructions (Addendum)
Medication Instructions:  Your physician has recommended you make the following change in your medication:  1.) STOP coreg 2.) STOP amiodarone 3.) START Zocor (simvastatin) 20 mg - one table daily 4.) START Toprol XL (metoprolol succinate) 25 mg --take HALF TAB daily at bedtime  *If you need a refill on your cardiac medications before your next appointment, please call your pharmacy*   Lab Work: Please return in 3 months for blood work (lipids and liver function)  If you have labs (blood work) drawn today and your tests are completely normal, you will receive your results only by: [place] (if you have MyChart) OR A paper copy in the mail If you have any lab test that is abnormal or we need to change your treatment, we will call you to review the results.   Testing/Procedures: Your physician has requested that you have an echocardiogram. Echocardiography is a painless test that uses sound waves to create images of your heart. It provides your doctor with information about the size and shape of your heart and how well your heart's chambers and valves are working. This procedure takes approximately one hour. There are no restrictions for this procedure.  ZIO heart monitor (Patch) - see instructions below.  Follow-Up: At [facility], you and your health needs are our priority.  As part of our continuing mission to provide you with exceptional heart care, we have created designated Provider Care Teams.  These Care Teams include your primary Cardiologist (physician) and Advanced Practice Providers (APPs -  Physician Assistants and Nurse Practitioners) who all work together to provide you with the care you need, when you need it.   Your next appointment:   6 month(s)  The format for your next appointment:   In Person  Provider:   [name], MD    Other Instructions  You have been referred to Cardiopulmonary Rehabilitation  [place] Monitor  Instructions  Your physician has requested you wear a ZIO patch monitor for 3 days.  This is a single patch monitor. [employer] supplies one patch monitor per enrollment. Additional stickers are not available. Please do not apply patch if you will be having a Nuclear Stress Test,  Echocardiogram, Cardiac CT, MRI, or Chest Xray during the period you would be wearing the  monitor. The patch cannot be worn during these tests. You cannot remove and re-apply the  ZIO XT patch monitor.  Your ZIO patch monitor will be mailed 3 day USPS to your address on file. It may take 3-5 days  to receive your monitor after you have been enrolled.  Once you have received your monitor, please review the enclosed instructions. Your monitor  has already been registered assigning a specific monitor serial # to you.  Billing and Patient Assistance Program Information  We have supplied [employer] with any of your insurance information on file for billing purposes. [employer] offers a sliding scale Patient Assistance Program for patients that do not have  insurance, or whose insurance does not completely cover the cost of the ZIO monitor.  You must apply for the Patient Assistance Program to qualify for this discounted rate.  To apply, please call [employer] at [phone], select option 4, select option 2, ask to apply for  Patient Assistance Program. Theodore Demark will ask your household income, and how many people  are in your household. They will quote your out-of-pocket cost based on that information.  [employer] will also be able to set up a 77-month, interest-free payment  plan if needed.  Applying the monitor   Shave hair from upper left chest.  Hold abrader disc by orange tab. Rub abrader in 40 strokes over the upper left chest as  indicated in your monitor instructions.  Clean area with 4 enclosed alcohol pads. Let dry.  Apply patch as indicated in monitor instructions. Patch will be placed under collarbone on left  side  of chest with arrow pointing upward.  Rub patch adhesive wings for 2 minutes. Remove white label marked "1". Remove the white  label marked "2". Rub patch adhesive wings for 2 additional minutes.  While looking in a mirror, press and release button in center of patch. A small green light will  flash 3-4 times. This will be your only indicator that the monitor has been turned on.  Do not shower for the first 24 hours. You may shower after the first 24 hours.  Press the button if you feel a symptom. You will hear a small click. Record Date, Time and  Symptom in the Patient Logbook.  When you are ready to remove the patch, follow instructions on the last 2 pages of Patient  Logbook. Stick patch monitor onto the last page of Patient Logbook.  Place Patient Logbook in the blue and white box. Use locking tab on box and tape box closed  securely. The blue and white box has prepaid postage on it. Please place it in the mailbox as  soon as possible. Your physician should have your test results approximately 7 days after the  monitor has been mailed back to [facility].  Call [place] at [phone] if you have questions regarding  your ZIO XT patch monitor. Call them immediately if you see an orange light blinking on your  monitor.  If your monitor falls off in less than 4 days, contact our Monitor department at [phone].  If your monitor becomes loose or falls off after 4 days call [employer] at [phone] for  suggestions on securing your monitor

## 2021-03-04 NOTE — Progress Notes (Unsigned)
Enrolled for [employer] to mail a ZIO XT long term holter monitor to the patients address on file.  

## 2021-03-07 ENCOUNTER — Encounter (HOSPITAL_COMMUNITY): Payer: Self-pay

## 2021-03-07 ENCOUNTER — Telehealth (HOSPITAL_COMMUNITY): Payer: Self-pay

## 2021-03-07 NOTE — Telephone Encounter (Signed)
Office referral received for Cardiac rehab, given to RN to review.Marland Kitchen

## 2021-03-07 NOTE — Telephone Encounter (Signed)
Called patient to see if he is interested in the Cardiac Rehab Program. Patient expressed interest. Explained scheduling process and went over insurance process, patient verbalized understanding. Will contact patient at a later date for scheduling.

## 2021-03-09 DIAGNOSIS — E782 Mixed hyperlipidemia: Secondary | ICD-10-CM

## 2021-03-09 DIAGNOSIS — I4891 Unspecified atrial fibrillation: Secondary | ICD-10-CM | POA: Diagnosis not present

## 2021-03-09 DIAGNOSIS — I10 Essential (primary) hypertension: Secondary | ICD-10-CM | POA: Diagnosis not present

## 2021-03-09 DIAGNOSIS — Z95828 Presence of other vascular implants and grafts: Secondary | ICD-10-CM

## 2021-03-10 ENCOUNTER — Other Ambulatory Visit: Payer: Medicare Other

## 2021-03-15 ENCOUNTER — Encounter: Payer: Self-pay | Admitting: Internal Medicine

## 2021-03-15 NOTE — Telephone Encounter (Signed)
Daniel Robinson is calling in regards to this patient message his wife sent this morning due to not being called back. He reports his BP is running in the 120's in the AM and 140's-150's in the PM. At one point it got up to 163. He states this just started occurring as of Friday. His HR got up to 116 this morning and was 106 at the time of the call. He takes all his medications at night around 7 PM. The best callback number to discuss this is [phone].

## 2021-03-15 NOTE — Telephone Encounter (Signed)
Called and spoke w patient. He reports BPs prior to taking Toprol XL 12.5 mg at around 8:0 pm up to 155-160, rechecking 2 hrs later getting 130s-140s/.  This am 120/87,   His HRs are consistently >90 up into 110s at times.  His pulse feels regular to him.  No periods of heart racing faster or skipping around.  Checks w pulse oximeter.  He has appointment at [facility] tomorrow.  Also continues w hacking cough, occas productive.  Coughing a lot during the call.  At last ov Dr. Ali Lowe discontinued Coreg 12.5 mg BID and started Toprol XL 12.5 mg daily.  I asked the patient to take Toprol XL 25 mg this evening and continue to take that dose daily unless Dr. Ali Lowe has other recommendations.  Adv to continue to monitor BP/HR and keep appointment with ct surgery tomorrow.

## 2021-03-16 ENCOUNTER — Other Ambulatory Visit: Payer: Self-pay

## 2021-03-16 ENCOUNTER — Ambulatory Visit (INDEPENDENT_AMBULATORY_CARE_PROVIDER_SITE_OTHER): Payer: Medicare Other | Admitting: Physician Assistant

## 2021-03-16 ENCOUNTER — Ambulatory Visit: Payer: Medicare Other

## 2021-03-16 VITALS — BP 125/79 | HR 109 | Resp 20 | Ht 69.0 in | Wt 169.0 lb

## 2021-03-16 DIAGNOSIS — Z9889 Other specified postprocedural states: Secondary | ICD-10-CM | POA: Diagnosis not present

## 2021-03-16 NOTE — Progress Notes (Signed)
° °   °  [address]       [place] [address]             [phone]       HPI:  Daniel Robinson is a 70 yo male with history of HTN and Hyperlipidemia.  The patient was hunting out [place] chest pain severe, he states pain got worse and felt like someone was ripping him apart.  EMS transported found to have suffered an Aortic Dissection.  This required emergent surgical intervention and consisted of Aortic Valve Replacement, Repair and Replacement of Ascending Aortic Aneurysm with a  25 mm [employer] Konect Resilia bovine aortic valved conduit. He presents today to establish care and for post operative follow up.  Since surgery the patient feels like he is getting better everyday.  States he just got back from [place] to watch his son play hockey.  He states this really tired him out.  Today he feels much better, feels like his energy level is coming back.  Appetite has improved.  He has resumed driving without difficulty.  His weight is actually less, he has lost about 14 lbs.  He denies any further chest pain or shortness of breath.  He is experiencing some sinus issues, which makes breathing a little difficulty.  He is already signed up for cardiac rehabilitation that is due to start at the end of this week.  He has been evaluated by Cardiology and his Lopressor was increased for additional HR control.  Current Outpatient Medications  Medication Sig Dispense Refill   aspirin 81 MG EC tablet Take 81 mg by mouth daily. Swallow whole.     cyclobenzaprine (FLEXERIL) 10 MG tablet Take 10 mg by mouth 3 (three) times daily as needed for muscle spasms.     metoprolol succinate (TOPROL-XL) 25 MG 24 hr tablet Take 25 mg by mouth daily.     simvastatin (ZOCOR) 20 MG tablet Take 1 tablet (20 mg total) by mouth at bedtime. 90 tablet 3   No current facility-administered medications for this visit.    Physical Exam:  BP 125/79 (BP Location: Right Leg, Patient Position:  Sitting)    Pulse (!) 109    Resp 20    Ht 5\' 9"  (1.753 m)    Wt 169 lb (76.7 kg)    SpO2 99% Comment: RA   BMI 24.96 kg/m   Gen: no apparent distress Heart: RRR, tachycardia Lungs: CTA bilaterally Abd: soft non-tender, non-distended Ext: no edema Incisions: well healed, some residual eschar on chest tube sites Neuro: grossly intact  Diagnostic Tests:  CXR 12/7: shows no evidence of pleural effusion, pneumothorax, Aortic Valve prosthesis in place, sternal wires in place   A/P:  Daniel Robinson is a 70 yo male with history of HTN and Hyperlipidemia.  He developed a Type A Aortic Dissection-while visiting his son in [place].  He underwent Emergent Repair/Replacement Ascending Aorta with Hemiarch and Replacement of Aorta.  He is doing very well post operatively.  He is due to cardiac rehabilitation later this week.  He was provided additional education on recovery rules, progression.  He was also provided education on Endocarditis prophylaxis.  We will plan to see the patient back in 3 months time with repeat CTA of chest to assess his aortic repair.  He will plan to see Dr. Elyse Hsu, PA-C [facility] [phone]

## 2021-03-16 NOTE — Patient Instructions (Signed)
Endocarditis is a potentially serious infection of heart valves or inside lining of the heart.  It occurs more commonly in patients with diseased heart valves (such as patient's with aortic or mitral valve disease) and in patients who have undergone heart valve repair or replacement.  Certain surgical and dental procedures may put you at risk, such as dental cleaning, other dental procedures, or any surgery involving the respiratory, urinary, gastrointestinal tract, gallbladder or prostate gland.   To minimize your chances for develooping endocarditis, maintain good oral health and seek prompt medical attention for any infections involving the mouth, teeth, gums, skin or urinary tract.    Always notify your doctor or dentist about your underlying heart valve condition before having any invasive procedures. You will need to take antibiotics before certain procedures, including all routine dental cleanings or other dental procedures.  Your cardiologist or dentist should prescribe these antibiotics for you to be taken ahead of time.  Make every effort to maintain a "heart-healthy" lifestyle with regular physical exercise and adherence to a low-fat, low-carbohydrate diet.  Continue to seek regular follow-up appointments with your primary care physician and/or cardiologist.  You may return to driving an automobile as long as you are no longer requiring oral narcotic pain relievers during the daytime.  It would be wise to start driving only short distances during the daylight and gradually increase from there as you feel comfortable.  You may continue to gradually increase your physical activity as tolerated.  Refrain from any heavy lifting or strenuous use of your arms and shoulders until at least 8 weeks from the time of your surgery, and avoid activities that cause increased pain in your chest on the side of your surgical incision.  Otherwise you may continue to increase activities without any particular  limitations.  Increase the intensity and duration of physical activity gradually.

## 2021-03-18 ENCOUNTER — Other Ambulatory Visit: Payer: Self-pay

## 2021-03-18 ENCOUNTER — Ambulatory Visit (HOSPITAL_COMMUNITY): Payer: Medicare Other | Attending: Cardiology

## 2021-03-18 DIAGNOSIS — I4891 Unspecified atrial fibrillation: Secondary | ICD-10-CM

## 2021-03-18 DIAGNOSIS — I10 Essential (primary) hypertension: Secondary | ICD-10-CM

## 2021-03-18 DIAGNOSIS — Z95828 Presence of other vascular implants and grafts: Secondary | ICD-10-CM

## 2021-03-18 DIAGNOSIS — E782 Mixed hyperlipidemia: Secondary | ICD-10-CM

## 2021-03-18 NOTE — Telephone Encounter (Signed)
Called patient to see if he was interested in participating in the Cardiac Rehab Program. Patient stated yes. Patient will come in for orientation on 04/14/2021@8 :00am and will attend the 7:00am exercise class.   Tourist information centre manager.

## 2021-03-19 LAB — ECHOCARDIOGRAM COMPLETE
AV Mean grad: 5 mmHg
AV Peak grad: 9.3 mmHg
Ao pk vel: 1.52 m/s
Area-P 1/2: 4.91 cm2
S' Lateral: 3.05 cm

## 2021-03-23 MED ORDER — METOPROLOL SUCCINATE ER 25 MG PO TB24: 25.0000 mg | ORAL_TABLET | Freq: Every day | ORAL | 3 refills | Status: DC

## 2021-03-24 ENCOUNTER — Telehealth: Payer: Self-pay | Admitting: Family Medicine

## 2021-03-24 NOTE — Telephone Encounter (Signed)
I called and spoke with patient. He is aware that Benicar was stopped due to his BP being low. He will not take the medication. Pt mentioned his BP is still running in the 140s-150s, he will follow up with cardiology to see if his medication needs to be increased again.

## 2021-03-24 NOTE — Telephone Encounter (Signed)
Pt was last seen on 03-02-2021 and pick up medoxomil 20 mg from his pharm and he does not know why med was prescribed. Please advise

## 2021-03-30 NOTE — Telephone Encounter (Signed)
Pt insurance is active and benefits verified through Medicare a/b Co-pay 0, DED $226/0 met, out of pocket 0/0 met, co-insurance 20%. no pre-authorization required. Passport, 03/30/2021'@11' :42am, REF# [id number]   2ndary insurance is active and benefits verified through [facility] Co-pay 0, DED 0/0 met, out of pocket 0/0 met, co-insurance 0. No pre-authorization required.

## 2021-03-31 ENCOUNTER — Ambulatory Visit: Payer: Medicare Other | Admitting: Internal Medicine

## 2021-04-01 ENCOUNTER — Telehealth (INDEPENDENT_AMBULATORY_CARE_PROVIDER_SITE_OTHER): Payer: Medicare Other | Admitting: Family Medicine

## 2021-04-01 ENCOUNTER — Encounter: Payer: Self-pay | Admitting: Family Medicine

## 2021-04-01 ENCOUNTER — Other Ambulatory Visit: Payer: Self-pay

## 2021-04-01 VITALS — BP 117/79 | HR 90 | Ht 69.0 in

## 2021-04-01 DIAGNOSIS — R300 Dysuria: Secondary | ICD-10-CM

## 2021-04-01 DIAGNOSIS — I10 Essential (primary) hypertension: Secondary | ICD-10-CM | POA: Diagnosis not present

## 2021-04-01 DIAGNOSIS — J310 Chronic rhinitis: Secondary | ICD-10-CM | POA: Diagnosis not present

## 2021-04-01 MED ORDER — AZELASTINE HCL 0.1 % NA SOLN: 1.0000 | Freq: Two times a day (BID) | NASAL | 0 refills | Status: DC

## 2021-04-01 MED ORDER — SULFAMETHOXAZOLE-TRIMETHOPRIM 800-160 MG PO TABS: 1.0000 | ORAL_TABLET | Freq: Two times a day (BID) | ORAL | 0 refills | Status: AC

## 2021-04-01 NOTE — Progress Notes (Signed)
Virtual Visit via Video Note I connected with Daniel Robinson on 04/01/21 by a video enabled telemedicine application and verified that I am speaking with the correct person using two identifiers.  Location patient: home Location provider:work or office Persons participating in the virtual visit: patient, provider  I discussed the limitations of evaluation and management by telemedicine and the availability of in person appointments. The patient expressed understanding and agreed to proceed.  Chief Complaint  Patient presents with   Hypertension    BP is still running high   HPI: Daniel Robinson is a 71 yo male with hx of HTN,HLD thoracic aortic dissection s/p aortic replacement concerned about some elevated BPs, 2 days ago. 140-160's/80-90's throughout the day. BP has improved, today 117/79 and yesterday most 120's/80's. On 03/02/21 some of his meds were discontinued because hypotension, including Benicar and Carvedilol. Established with cardiologist on 03/04/21, Metoprolol succinate was started.  Currently he is on Benicar 20 mg daily and metoprolol succinate 25 mg daily, he takes both meds at night.  Negative for unusual/frequent headache, CP, dyspnea, wheezing, or diaphoresis. Occasional palpitations that last < 1 min. HR average 90's.  Fatigue is gradually improving. He is planning on starting cardiac rehab next week.  Nasal congestion, rhinorrhea, and postnasal drainage. Mild nonproductive cough, improving.  He is using Flonase nasal spray, which helps temporarily.  Lab Results  Component Value Date   CREATININE 0.79 03/02/2021   BUN 13 03/02/2021   NA 137 03/02/2021   K 4.5 03/02/2021   K 4.5 03/02/2021   CL 105 03/02/2021   CO2 28 03/02/2021   Since hospital discharge, 01/2021, he has had mild dysuria and decreased urine stream, it seems more noticeable now.  Attributed initially to having urine cath during hospitalization. He was treated for UTI during  hospitalization. UA at the time of hospital discharge was clear.  Dysuria at the beginning of urination. Negative for gross hematuria, incontinence,or nocturia. No fever, chills, abdominal pain, urethral discharge, genital lesions, or rectal pain.  ROS: See pertinent positives and negatives per HPI.  Past Medical History:  Diagnosis Date   Hyperlipidemia    Hypertension    Past Surgical History:  Procedure Laterality Date   BACK SURGERY     REPAIR OF ACUTE ASCENDING THORACIC AORTIC DISSECTION  02/10/2021   INTRA-OP TEE, AORTIC VALVE REPLACEMENT, AORTIC ROOT REPLACEMENT, ASCENDING AORTA REPLACEMENT, STERNAL PLATING, PLACEMENT OF THE PREVENA INCISION WOUND MANAGEMENT SYSTEM   SHOULDER SURGERY     Family History  Problem Relation Age of Onset   Hyperlipidemia Mother    Hypertension Mother    Social History   Socioeconomic History   Marital status: Married    Spouse name: Not on file   Number of children: Not on file   Years of education: Not on file   Highest education level: Not on file  Occupational History   Not on file  Tobacco Use   Smoking status: Never   Smokeless tobacco: Never  Substance and Sexual Activity   Alcohol use: Yes   Drug use: No   Sexual activity: Not on file  Other Topics Concern   Not on file  Social History Narrative   Not on file   Social Determinants of Health   Financial Resource Strain: Not on file  Food Insecurity: Not on file  Transportation Needs: Not on file  Physical Activity: Not on file  Stress: Not on file  Social Connections: Not on file  Intimate Partner Violence: Not on file  Current Outpatient Medications:    aspirin 81 MG EC tablet, Take 81 mg by mouth daily. Swallow whole., Disp: , Rfl:    cyclobenzaprine (FLEXERIL) 10 MG tablet, Take 10 mg by mouth 3 (three) times daily as needed for muscle spasms., Disp: , Rfl:    metoprolol succinate (TOPROL-XL) 25 MG 24 hr tablet, Take 25 mg by mouth daily., Disp: , Rfl:     simvastatin (ZOCOR) 20 MG tablet, Take 1 tablet (20 mg total) by mouth at bedtime., Disp: 90 tablet, Rfl: 3  EXAM:  VITALS per patient if applicable:BP 263/33    Pulse 90    Ht 5\' 9"  (1.753 m)    BMI 24.96 kg/m   GENERAL: alert, oriented, appears well and in no acute distress  HEENT: atraumatic, conjunctiva clear, no obvious abnormalities on inspection.  NECK: normal movements of the head and neck  LUNGS: on inspection no signs of respiratory distress, breathing rate appears normal, no obvious gross SOB, gasping or wheezing  CV: no obvious cyanosis  MS: moves all visible extremities without noticeable abnormality  PSYCH/NEURO: pleasant and cooperative, no obvious depression or anxiety, speech and thought processing grossly intact  ASSESSMENT AND PLAN:  Discussed the following assessment and plan:  Dysuria Lab service is closing and Friday, so empiric UTI treatment recommended. Bactrim DS bid x 7d. Instructed about warning signs. Adequate hydration. If not resolved UA,Ucx,and PSA will be obtained.  Rhinitis, unspecified type Continue Flonase nasal spray daily as needed. Astelin nasal spray 1 srap bid. Nasal saline irrigations as needed.  Hypertension In general BP readings are adequate now. Recommend taking Benicar 20 mg in the morning and continue metoprolol succinate 25 mg at night. Continue monitoring BP and HR regularly. Continue low-salt diet. Instructed about warning signs.  We discussed possible serious and likely etiologies, options for evaluation and workup, limitations of telemedicine visit vs in person visit, treatment, treatment risks and precautions. The patient was advised to call back or seek an in-person evaluation if the symptoms worsen or if the condition fails to improve as anticipated. I discussed the assessment and treatment plan with the patient. The patient was provided an opportunity to ask questions and all were answered. The patient agreed with  the plan and demonstrated an understanding of the instructions.  Return if symptoms worsen or fail to improve, for Keep next appt..  Ebelyn [name] G. [name], MD  [facility]. [place] office.

## 2021-04-01 NOTE — Assessment & Plan Note (Addendum)
In general BP readings are adequate now. Recommend taking Benicar 20 mg in the morning and continue metoprolol succinate 25 mg at night. Continue monitoring BP and HR regularly. Continue low-salt diet. Instructed about warning signs.

## 2021-04-02 ENCOUNTER — Encounter: Payer: Self-pay | Admitting: Family Medicine

## 2021-04-08 ENCOUNTER — Telehealth (HOSPITAL_COMMUNITY): Payer: Self-pay | Admitting: *Deleted

## 2021-04-08 NOTE — Telephone Encounter (Signed)
Spoke with the patient. Completed health history. Confirmed appointment for 04/14/21.Harrell Gave RN BSN

## 2021-04-14 ENCOUNTER — Other Ambulatory Visit: Payer: Self-pay

## 2021-04-14 ENCOUNTER — Telehealth (HOSPITAL_COMMUNITY): Payer: Self-pay | Admitting: *Deleted

## 2021-04-14 ENCOUNTER — Telehealth: Payer: Self-pay | Admitting: Internal Medicine

## 2021-04-14 ENCOUNTER — Encounter (HOSPITAL_COMMUNITY): Payer: Self-pay

## 2021-04-14 ENCOUNTER — Encounter (HOSPITAL_COMMUNITY)
Admission: RE | Admit: 2021-04-14 | Discharge: 2021-04-14 | Disposition: A | Discharge status: Home / Self Care | Payer: Medicare Other | Source: Ambulatory Visit | Attending: Internal Medicine | Admitting: Internal Medicine

## 2021-04-14 VITALS — BP 104/70 | HR 100 | Ht 69.25 in | Wt 170.2 lb

## 2021-04-14 DIAGNOSIS — Z48812 Encounter for surgical aftercare following surgery on the circulatory system: Secondary | ICD-10-CM | POA: Insufficient documentation

## 2021-04-14 DIAGNOSIS — Z952 Presence of prosthetic heart valve: Secondary | ICD-10-CM | POA: Insufficient documentation

## 2021-04-14 NOTE — Progress Notes (Incomplete)
Cardiac Individual Treatment Plan  Patient Details  Name: Daniel Robinson MRN: [id number] Date of Birth: 09-10-1950 Referring Provider:   Flowsheet Row CARDIAC REHAB PHASE II ORIENTATION from 04/14/2021 in [place]  Referring Provider [name], MD       Initial Encounter Date:  [place] PHASE II ORIENTATION from 04/14/2021 in [place]  Date 04/14/21       Visit Diagnosis: 02/10/21 S/P Aortic Dissection, S/P Aortic Valve Replacement, [place]  Patient's Home Medications on Admission:  Current Outpatient Medications:    aspirin 81 MG EC tablet, Take 81 mg by mouth every evening. Swallow whole., Disp: , Rfl:    azelastine (ASTELIN) 0.1 % nasal spray, Place 1-2 sprays into both nostrils 2 (two) times daily. Use in each nostril as directed (Patient taking differently: Place 1-2 sprays into both nostrils 2 (two) times daily as needed (nasal drainage). Use in each nostril as directed), Disp: 30 mL, Rfl: 0   cyclobenzaprine (FLEXERIL) 10 MG tablet, Take 10 mg by mouth 3 (three) times daily as needed for muscle spasms., Disp: , Rfl:    docusate sodium (COLACE) 100 MG capsule, Take 200 mg by mouth every evening., Disp: , Rfl:    metoprolol succinate (TOPROL-XL) 25 MG 24 hr tablet, Take 25 mg by mouth every evening., Disp: , Rfl:    olmesartan (BENICAR) 20 MG tablet, Take 10 mg by mouth in the morning and at bedtime., Disp: , Rfl:    simvastatin (ZOCOR) 20 MG tablet, Take 1 tablet (20 mg total) by mouth at bedtime., Disp: 90 tablet, Rfl: 3  Past Medical History: Past Medical History:  Diagnosis Date   Hyperlipidemia    Hypertension     Tobacco Use: Social History   Tobacco Use  Smoking Status Never  Smokeless Tobacco Never    Labs: Recent Review Flowsheet Data     Labs for ITP Cardiac and Pulmonary Rehab Latest Ref Rng & Units 12/16/2012 11/14/2017 03/20/2019   Cholestrol 0 -  200 mg/dL 175 220(H) 218(H)   LDLCALC 0 - 99 mg/dL 101(H) - 144(H)   LDLDIRECT mg/dL - 119.0 -   HDL >39.00 mg/dL 38.30(L) 40.20 42.30   Trlycerides 0.0 - 149.0 mg/dL 179.0(H) 334.0(H) 156.0(H)       Capillary Blood Glucose: No results found for: GLUCAP   Exercise Target Goals: Exercise Program Goal: Individual exercise prescription set using results from initial 6 min walk test and THRR while considering  patients activity barriers and safety.   Exercise Prescription Goal: Starting with aerobic activity 30 plus minutes a day, 3 days per week for initial exercise prescription. Provide home exercise prescription and guidelines that participant acknowledges understanding prior to discharge.  Activity Barriers & Risk Stratification:  Activity Barriers & Cardiac Risk Stratification - 04/14/21 1348       Activity Barriers & Cardiac Risk Stratification   Activity Barriers Back Problems;Neck/Spine Problems;Deconditioning;Muscular Weakness;Other (comment)    Comments Dizziness    Cardiac Risk Stratification High             6 Minute Walk:  6 Minute Walk     Row Name 04/14/21 0847         6 Minute Walk   Phase Initial     Distance 1564 feet     Walk Time 6 minutes     # of Rest Breaks 0     MPH 2.96     METS 3.45  RPE 10     Perceived Dyspnea  0     VO2 Peak 12.07     Symptoms No     Resting HR 93 bpm     Resting BP 104/70     Resting Oxygen Saturation  97 %     Exercise Oxygen Saturation  during 6 min walk 100 %     Max Ex. HR 102 bpm     Max Ex. BP 100/60     2 Minute Post BP 92/60              Oxygen Initial Assessment:   Oxygen Re-Evaluation:   Oxygen Discharge (Final Oxygen Re-Evaluation):   Initial Exercise Prescription:  Initial Exercise Prescription - 04/14/21 1300       Date of Initial Exercise RX and Referring Provider   Date 04/14/21    Referring Provider [name], MD    Expected Discharge Date 06/10/21      NuStep    Level 2    SPM 80    Minutes 15    METs 2.5      Arm Ergometer   Level 1.8    Minutes 15    METs 2.3      Prescription Details   Frequency (times per week) 3    Duration Progress to 30 minutes of continuous aerobic without signs/symptoms of physical distress      Intensity   THRR 40-80% of Max Heartrate 61-122    Ratings of Perceived Exertion 11-13    Perceived Dyspnea 0-4      Progression   Progression Continue progressive overload as per policy without signs/symptoms or physical distress.      Resistance Training   Training Prescription Yes    Weight 3    Reps 10-15             Perform Capillary Blood Glucose checks as needed.  Exercise Prescription Changes:   Exercise Comments:   Exercise Goals and Review:   Exercise Goals     Row Name 04/14/21 1350             Exercise Goals   Increase Physical Activity Yes       Intervention Provide advice, education, support and counseling about physical activity/exercise needs.;Develop an individualized exercise prescription for aerobic and resistive training based on initial evaluation findings, risk stratification, comorbidities and participant's personal goals.       Expected Outcomes Short Term: Attend rehab on a regular basis to increase amount of physical activity.;Long Term: Add in home exercise to make exercise part of routine and to increase amount of physical activity.;Long Term: Exercising regularly at least 3-5 days a week.       Increase Strength and Stamina Yes       Intervention Provide advice, education, support and counseling about physical activity/exercise needs.;Develop an individualized exercise prescription for aerobic and resistive training based on initial evaluation findings, risk stratification, comorbidities and participant's personal goals.       Expected Outcomes Short Term: Increase workloads from initial exercise prescription for resistance, speed, and METs.;Short Term: Perform resistance  training exercises routinely during rehab and add in resistance training at home;Long Term: Improve cardiorespiratory fitness, muscular endurance and strength as measured by increased METs and functional capacity (6MWT)       Able to understand and use rate of perceived exertion (RPE) scale Yes       Intervention Provide education and explanation on how to use RPE scale  Expected Outcomes Short Term: Able to use RPE daily in rehab to express subjective intensity level;Long Term:  Able to use RPE to guide intensity level when exercising independently       Knowledge and understanding of Target Heart Rate Range (THRR) Yes       Intervention Provide education and explanation of THRR including how the numbers were predicted and where they are located for reference       Expected Outcomes Short Term: Able to state/look up THRR;Short Term: Able to use daily as guideline for intensity in rehab       Understanding of Exercise Prescription Yes       Intervention Provide education, explanation, and written materials on patient's individual exercise prescription       Expected Outcomes Short Term: Able to explain program exercise prescription;Long Term: Able to explain home exercise prescription to exercise independently                Exercise Goals Re-Evaluation :    Discharge Exercise Prescription (Final Exercise Prescription Changes):   Nutrition:  Target Goals: Understanding of nutrition guidelines, daily intake of sodium 1500mg , cholesterol 200mg , calories 30% from fat and 7% or less from saturated fats, daily to have 5 or more servings of fruits and vegetables.  Biometrics:  Pre Biometrics - 04/14/21 0815       Pre Biometrics   Waist Circumference 38 inches    Hip Circumference 38 inches    Waist to Hip Ratio 1 %    Triceps Skinfold 11 mm    % Body Fat 24.3 %    Grip Strength 38 kg    Flexibility 0 in   Could not reach box   Single Leg Stand 30 seconds               Nutrition Therapy Plan and Nutrition Goals:   Nutrition Assessments:  MEDIFICTS Score Key: ?70 Need to make dietary changes  40-70 Heart Healthy Diet ? 40 Therapeutic Level Cholesterol Diet   Picture Your Plate Scores: <10 Unhealthy dietary pattern with much room for improvement. 41-50 Dietary pattern unlikely to meet recommendations for good health and room for improvement. 51-60 More healthful dietary pattern, with some room for improvement.  >60 Healthy dietary pattern, although there may be some specific behaviors that could be improved.    Nutrition Goals Re-Evaluation:   Nutrition Goals Discharge (Final Nutrition Goals Re-Evaluation):   Psychosocial: Target Goals: Acknowledge presence or absence of significant depression and/or stress, maximize coping skills, provide positive support system. Participant is able to verbalize types and ability to use techniques and skills needed for reducing stress and depression.  Initial Review & Psychosocial Screening:  Initial Psych Review & Screening - 04/14/21 0845       Initial Review   Current issues with None Identified      Family Dynamics   Good Support System? Yes   Melven has his wife and children for support     Barriers   Psychosocial barriers to participate in program There are no identifiable barriers or psychosocial needs.      Screening Interventions   Interventions Encouraged to exercise             Quality of Life Scores:  Quality of Life - 04/14/21 1337       Quality of Life   Select Quality of Life      Quality of Life Scores   Health/Function Pre 24.67 %    Socioeconomic Pre 21.64 %  Psych/Spiritual Pre 28.57 %    Family Pre 29.5 %    GLOBAL Pre 25.56 %            Scores of 19 and below usually indicate a poorer quality of life in these areas.  A difference of  2-3 points is a clinically meaningful difference.  A difference of 2-3 points in the total score of the Quality of Life  Index has been associated with significant improvement in overall quality of life, self-image, physical symptoms, and general health in studies assessing change in quality of life.  PHQ-9: Recent Review Flowsheet Data     Depression screen [facility] 2/9 02/21/2021 09/09/2019 09/09/2019 11/14/2017   Decreased Interest 0 0 0 0   Down, Depressed, Hopeless 0 0 0 0   PHQ - 2 Score 0 0 0 0   Altered sleeping - 0 - -   Tired, decreased energy - 0 - -   Change in appetite - 0 - -   Feeling bad or failure about yourself  - 0 - -   Trouble concentrating - 0 - -   Moving slowly or fidgety/restless - 0 - -   Suicidal thoughts - 0 - -   PHQ-9 Score - 0 - -   Difficult doing work/chores - Not difficult at all - -      Interpretation of Total Score  Total Score Depression Severity:  1-4 = Minimal depression, 5-9 = Mild depression, 10-14 = Moderate depression, 15-19 = Moderately severe depression, 20-27 = Severe depression   Psychosocial Evaluation and Intervention:   Psychosocial Re-Evaluation:   Psychosocial Discharge (Final Psychosocial Re-Evaluation):   Vocational Rehabilitation: Provide vocational rehab assistance to qualifying candidates.   Vocational Rehab Evaluation & Intervention:  Vocational Rehab - 04/14/21 0847       Initial Vocational Rehab Evaluation & Intervention   Assessment shows need for Vocational Rehabilitation No   Khalik is retired and does not need vocational rehab at this time            Education: Education Goals: Education classes will be provided on a weekly basis, covering required topics. Participant will state understanding/return demonstration of topics presented.  Learning Barriers/Preferences:  Learning Barriers/Preferences - 04/14/21 1344       Learning Barriers/Preferences   Learning Barriers Sight   wears glasses   Learning Preferences Audio;Individual Instruction;Group Instruction;Verbal Instruction;Skilled Demonstration              Education Topics: Hypertension, Hypertension Reduction -Define heart disease and high blood pressure. Discus how high blood pressure affects the body and ways to reduce high blood pressure.   Exercise and Your Heart -Discuss why it is important to exercise, the FITT principles of exercise, normal and abnormal responses to exercise, and how to exercise safely.   Angina -Discuss definition of angina, causes of angina, treatment of angina, and how to decrease risk of having angina.   Cardiac Medications -Review what the following cardiac medications are used for, how they affect the body, and side effects that may occur when taking the medications.  Medications include Aspirin, Beta blockers, calcium channel blockers, ACE Inhibitors, angiotensin receptor blockers, diuretics, digoxin, and antihyperlipidemics.   Congestive Heart Failure -Discuss the definition of CHF, how to live with CHF, the signs and symptoms of CHF, and how keep track of weight and sodium intake.   Heart Disease and Intimacy -Discus the effect sexual activity has on the heart, how changes occur during intimacy as we age, and safety  during sexual activity.   Smoking Cessation / COPD -Discuss different methods to quit smoking, the health benefits of quitting smoking, and the definition of COPD.   Nutrition I: Fats -Discuss the types of cholesterol, what cholesterol does to the heart, and how cholesterol levels can be controlled.   Nutrition II: Labels -Discuss the different components of food labels and how to read food label   Heart Parts/Heart Disease and PAD -Discuss the anatomy of the heart, the pathway of blood circulation through the heart, and these are affected by heart disease.   Stress I: Signs and Symptoms -Discuss the causes of stress, how stress may lead to anxiety and depression, and ways to limit stress.   Stress II: Relaxation -Discuss different types of relaxation techniques to limit  stress.   Warning Signs of Stroke / TIA -Discuss definition of a stroke, what the signs and symptoms are of a stroke, and how to identify when someone is having stroke.   Knowledge Questionnaire Score:  Knowledge Questionnaire Score - 04/14/21 1337       Knowledge Questionnaire Score   Pre Score 21/24             Core Components/Risk Factors/Patient Goals at Admission:  Personal Goals and Risk Factors at Admission - 04/14/21 1345       Core Components/Risk Factors/Patient Goals on Admission    Weight Management Weight Maintenance    Hypertension Yes    Intervention Provide education on lifestyle modifcations including regular physical activity/exercise, weight management, moderate sodium restriction and increased consumption of fresh fruit, vegetables, and low fat dairy, alcohol moderation, and smoking cessation.;Monitor prescription use compliance.    Expected Outcomes Long Term: Maintenance of blood pressure at goal levels.;Short Term: Continued assessment and intervention until BP is < 140/11mm HG in hypertensive participants. < 130/30mm HG in hypertensive participants with diabetes, heart failure or chronic kidney disease.    Lipids Yes    Intervention Provide education and support for participant on nutrition & aerobic/resistive exercise along with prescribed medications to achieve LDL 70mg , HDL >40mg .    Expected Outcomes Short Term: Participant states understanding of desired cholesterol values and is compliant with medications prescribed. Participant is following exercise prescription and nutrition guidelines.;Long Term: Cholesterol controlled with medications as prescribed, with individualized exercise RX and with personalized nutrition plan. Value goals: LDL < 70mg , HDL > 40 mg.             Core Components/Risk Factors/Patient Goals Review:    Core Components/Risk Factors/Patient Goals at Discharge (Final Review):    ITP Comments:  ITP Comments     Row Name  04/14/21 0844           ITP Comments Dr Fransico Him MD, Medical Director                Comments: ***

## 2021-04-14 NOTE — Telephone Encounter (Signed)
Daniel Robinson from cardiac to report patient's low BP.  He BP today was 92/60 when he left therapy today, he had no symptoms.  He does report feels lightheaded at home.  She wants to know if Dr. [name] want to adjust his medication. She is going to fax over his BP's that he had a rehab today.  Daniel Robinson will be in until 4:30 today she can be reached at [phone].

## 2021-04-14 NOTE — Telephone Encounter (Signed)
Fax received. Will place in Dr. Ali Lowe box for review.   In fax:  Check in- 104/70 93 Walk 8.0- 100/60 102 Cool down- 92/60 93  Additional BP: sitting 104/50, standing 122/60  Fax also includes cardiac strips.

## 2021-04-14 NOTE — Progress Notes (Signed)
Cardiac Individual Treatment Plan  Patient Details  Name: AVYUKT CIMO MRN: [id number] Date of Birth: 1950/07/15 Referring Provider:   Flowsheet Row CARDIAC REHAB PHASE II ORIENTATION from 04/14/2021 in [place]  Referring Provider [name], MD       Initial Encounter Date:  [place] PHASE II ORIENTATION from 04/14/2021 in [facility]  Date 04/14/21       Visit Diagnosis: 02/10/21 S/P Aortic Dissection, S/P Aortic Valve Replacement, [place]  Patient's Home Medications on Admission:  Current Outpatient Medications:    aspirin 81 MG EC tablet, Take 81 mg by mouth every evening. Swallow whole., Disp: , Rfl:    azelastine (ASTELIN) 0.1 % nasal spray, Place 1-2 sprays into both nostrils 2 (two) times daily. Use in each nostril as directed (Patient taking differently: Place 1-2 sprays into both nostrils 2 (two) times daily as needed (nasal drainage). Use in each nostril as directed), Disp: 30 mL, Rfl: 0   cyclobenzaprine (FLEXERIL) 10 MG tablet, Take 10 mg by mouth 3 (three) times daily as needed for muscle spasms., Disp: , Rfl:    docusate sodium (COLACE) 100 MG capsule, Take 200 mg by mouth every evening., Disp: , Rfl:    metoprolol succinate (TOPROL-XL) 25 MG 24 hr tablet, Take 25 mg by mouth every evening., Disp: , Rfl:    olmesartan (BENICAR) 20 MG tablet, Take 10 mg by mouth in the morning and at bedtime., Disp: , Rfl:    simvastatin (ZOCOR) 20 MG tablet, Take 1 tablet (20 mg total) by mouth at bedtime., Disp: 90 tablet, Rfl: 3  Past Medical History: Past Medical History:  Diagnosis Date   Hyperlipidemia    Hypertension     Tobacco Use: Social History   Tobacco Use  Smoking Status Never  Smokeless Tobacco Never    Labs: Recent Review Flowsheet Data     Labs for ITP Cardiac and Pulmonary Rehab Latest Ref Rng & Units 12/16/2012 11/14/2017 03/20/2019   Cholestrol 0 -  200 mg/dL 175 220(H) 218(H)   LDLCALC 0 - 99 mg/dL 101(H) - 144(H)   LDLDIRECT mg/dL - 119.0 -   HDL >39.00 mg/dL 38.30(L) 40.20 42.30   Trlycerides 0.0 - 149.0 mg/dL 179.0(H) 334.0(H) 156.0(H)       Capillary Blood Glucose: No results found for: GLUCAP   Exercise Target Goals: Exercise Program Goal: Individual exercise prescription set using results from initial 6 min walk test and THRR while considering  patients activity barriers and safety.   Exercise Prescription Goal: Starting with aerobic activity 30 plus minutes a day, 3 days per week for initial exercise prescription. Provide home exercise prescription and guidelines that participant acknowledges understanding prior to discharge.  Activity Barriers & Risk Stratification:  Activity Barriers & Cardiac Risk Stratification - 04/14/21 1348       Activity Barriers & Cardiac Risk Stratification   Activity Barriers Back Problems;Neck/Spine Problems;Deconditioning;Muscular Weakness;Other (comment)    Comments Dizziness    Cardiac Risk Stratification High             6 Minute Walk:  6 Minute Walk     Row Name 04/14/21 0847         6 Minute Walk   Phase Initial     Distance 1564 feet     Walk Time 6 minutes     # of Rest Breaks 0     MPH 2.96     METS 3.45  RPE 10     Perceived Dyspnea  0     VO2 Peak 12.07     Symptoms No     Resting HR 93 bpm     Resting BP 104/70     Resting Oxygen Saturation  97 %     Exercise Oxygen Saturation  during 6 min walk 100 %     Max Ex. HR 102 bpm     Max Ex. BP 100/60     2 Minute Post BP 92/60              Oxygen Initial Assessment:   Oxygen Re-Evaluation:   Oxygen Discharge (Final Oxygen Re-Evaluation):   Initial Exercise Prescription:  Initial Exercise Prescription - 04/14/21 1300       Date of Initial Exercise RX and Referring Provider   Date 04/14/21    Referring Provider [name], MD    Expected Discharge Date 06/10/21      NuStep    Level 2    SPM 80    Minutes 15    METs 2.5      Arm Ergometer   Level 1.8    Minutes 15    METs 2.3      Prescription Details   Frequency (times per week) 3    Duration Progress to 30 minutes of continuous aerobic without signs/symptoms of physical distress      Intensity   THRR 40-80% of Max Heartrate 61-122    Ratings of Perceived Exertion 11-13    Perceived Dyspnea 0-4      Progression   Progression Continue progressive overload as per policy without signs/symptoms or physical distress.      Resistance Training   Training Prescription Yes    Weight 3    Reps 10-15             Perform Capillary Blood Glucose checks as needed.  Exercise Prescription Changes:   Exercise Comments:   Exercise Goals and Review:   Exercise Goals     Row Name 04/14/21 1350             Exercise Goals   Increase Physical Activity Yes       Intervention Provide advice, education, support and counseling about physical activity/exercise needs.;Develop an individualized exercise prescription for aerobic and resistive training based on initial evaluation findings, risk stratification, comorbidities and participant's personal goals.       Expected Outcomes Short Term: Attend rehab on a regular basis to increase amount of physical activity.;Long Term: Add in home exercise to make exercise part of routine and to increase amount of physical activity.;Long Term: Exercising regularly at least 3-5 days a week.       Increase Strength and Stamina Yes       Intervention Provide advice, education, support and counseling about physical activity/exercise needs.;Develop an individualized exercise prescription for aerobic and resistive training based on initial evaluation findings, risk stratification, comorbidities and participant's personal goals.       Expected Outcomes Short Term: Increase workloads from initial exercise prescription for resistance, speed, and METs.;Short Term: Perform resistance  training exercises routinely during rehab and add in resistance training at home;Long Term: Improve cardiorespiratory fitness, muscular endurance and strength as measured by increased METs and functional capacity (6MWT)       Able to understand and use rate of perceived exertion (RPE) scale Yes       Intervention Provide education and explanation on how to use RPE scale  Expected Outcomes Short Term: Able to use RPE daily in rehab to express subjective intensity level;Long Term:  Able to use RPE to guide intensity level when exercising independently       Knowledge and understanding of Target Heart Rate Range (THRR) Yes       Intervention Provide education and explanation of THRR including how the numbers were predicted and where they are located for reference       Expected Outcomes Short Term: Able to state/look up THRR;Short Term: Able to use daily as guideline for intensity in rehab       Understanding of Exercise Prescription Yes       Intervention Provide education, explanation, and written materials on patient's individual exercise prescription       Expected Outcomes Short Term: Able to explain program exercise prescription;Long Term: Able to explain home exercise prescription to exercise independently                Exercise Goals Re-Evaluation :    Discharge Exercise Prescription (Final Exercise Prescription Changes):   Nutrition:  Target Goals: Understanding of nutrition guidelines, daily intake of sodium 1500mg , cholesterol 200mg , calories 30% from fat and 7% or less from saturated fats, daily to have 5 or more servings of fruits and vegetables.  Biometrics:  Pre Biometrics - 04/14/21 0815       Pre Biometrics   Waist Circumference 38 inches    Hip Circumference 38 inches    Waist to Hip Ratio 1 %    Triceps Skinfold 11 mm    % Body Fat 24.3 %    Grip Strength 38 kg    Flexibility 0 in   Could not reach box   Single Leg Stand 30 seconds               Nutrition Therapy Plan and Nutrition Goals:   Nutrition Assessments:  MEDIFICTS Score Key: ?70 Need to make dietary changes  40-70 Heart Healthy Diet ? 40 Therapeutic Level Cholesterol Diet   Picture Your Plate Scores: <40 Unhealthy dietary pattern with much room for improvement. 41-50 Dietary pattern unlikely to meet recommendations for good health and room for improvement. 51-60 More healthful dietary pattern, with some room for improvement.  >60 Healthy dietary pattern, although there may be some specific behaviors that could be improved.    Nutrition Goals Re-Evaluation:   Nutrition Goals Discharge (Final Nutrition Goals Re-Evaluation):   Psychosocial: Target Goals: Acknowledge presence or absence of significant depression and/or stress, maximize coping skills, provide positive support system. Participant is able to verbalize types and ability to use techniques and skills needed for reducing stress and depression.  Initial Review & Psychosocial Screening:  Initial Psych Review & Screening - 04/14/21 0845       Initial Review   Current issues with None Identified      Family Dynamics   Good Support System? Yes   Tadhg has his wife and children for support     Barriers   Psychosocial barriers to participate in program There are no identifiable barriers or psychosocial needs.      Screening Interventions   Interventions Encouraged to exercise             Quality of Life Scores:  Quality of Life - 04/14/21 1337       Quality of Life   Select Quality of Life      Quality of Life Scores   Health/Function Pre 24.67 %    Socioeconomic Pre 21.64 %  Psych/Spiritual Pre 28.57 %    Family Pre 29.5 %    GLOBAL Pre 25.56 %            Scores of 19 and below usually indicate a poorer quality of life in these areas.  A difference of  2-3 points is a clinically meaningful difference.  A difference of 2-3 points in the total score of the Quality of Life  Index has been associated with significant improvement in overall quality of life, self-image, physical symptoms, and general health in studies assessing change in quality of life.  PHQ-9: Recent Review Flowsheet Data     Depression screen [facility] 2/9 02/21/2021 09/09/2019 09/09/2019 11/14/2017   Decreased Interest 0 0 0 0   Down, Depressed, Hopeless 0 0 0 0   PHQ - 2 Score 0 0 0 0   Altered sleeping - 0 - -   Tired, decreased energy - 0 - -   Change in appetite - 0 - -   Feeling bad or failure about yourself  - 0 - -   Trouble concentrating - 0 - -   Moving slowly or fidgety/restless - 0 - -   Suicidal thoughts - 0 - -   PHQ-9 Score - 0 - -   Difficult doing work/chores - Not difficult at all - -      Interpretation of Total Score  Total Score Depression Severity:  1-4 = Minimal depression, 5-9 = Mild depression, 10-14 = Moderate depression, 15-19 = Moderately severe depression, 20-27 = Severe depression   Psychosocial Evaluation and Intervention:   Psychosocial Re-Evaluation:   Psychosocial Discharge (Final Psychosocial Re-Evaluation):   Vocational Rehabilitation: Provide vocational rehab assistance to qualifying candidates.   Vocational Rehab Evaluation & Intervention:  Vocational Rehab - 04/14/21 0847       Initial Vocational Rehab Evaluation & Intervention   Assessment shows need for Vocational Rehabilitation No   Ricardo is retired and does not need vocational rehab at this time            Education: Education Goals: Education classes will be provided on a weekly basis, covering required topics. Participant will state understanding/return demonstration of topics presented.  Learning Barriers/Preferences:  Learning Barriers/Preferences - 04/14/21 1344       Learning Barriers/Preferences   Learning Barriers Sight   wears glasses   Learning Preferences Audio;Individual Instruction;Group Instruction;Verbal Instruction;Skilled Demonstration              Education Topics: Hypertension, Hypertension Reduction -Define heart disease and high blood pressure. Discus how high blood pressure affects the body and ways to reduce high blood pressure.   Exercise and Your Heart -Discuss why it is important to exercise, the FITT principles of exercise, normal and abnormal responses to exercise, and how to exercise safely.   Angina -Discuss definition of angina, causes of angina, treatment of angina, and how to decrease risk of having angina.   Cardiac Medications -Review what the following cardiac medications are used for, how they affect the body, and side effects that may occur when taking the medications.  Medications include Aspirin, Beta blockers, calcium channel blockers, ACE Inhibitors, angiotensin receptor blockers, diuretics, digoxin, and antihyperlipidemics.   Congestive Heart Failure -Discuss the definition of CHF, how to live with CHF, the signs and symptoms of CHF, and how keep track of weight and sodium intake.   Heart Disease and Intimacy -Discus the effect sexual activity has on the heart, how changes occur during intimacy as we age, and safety  during sexual activity.   Smoking Cessation / COPD -Discuss different methods to quit smoking, the health benefits of quitting smoking, and the definition of COPD.   Nutrition I: Fats -Discuss the types of cholesterol, what cholesterol does to the heart, and how cholesterol levels can be controlled.   Nutrition II: Labels -Discuss the different components of food labels and how to read food label   Heart Parts/Heart Disease and PAD -Discuss the anatomy of the heart, the pathway of blood circulation through the heart, and these are affected by heart disease.   Stress I: Signs and Symptoms -Discuss the causes of stress, how stress may lead to anxiety and depression, and ways to limit stress.   Stress II: Relaxation -Discuss different types of relaxation techniques to limit  stress.   Warning Signs of Stroke / TIA -Discuss definition of a stroke, what the signs and symptoms are of a stroke, and how to identify when someone is having stroke.   Knowledge Questionnaire Score:  Knowledge Questionnaire Score - 04/14/21 1337       Knowledge Questionnaire Score   Pre Score 21/24             Core Components/Risk Factors/Patient Goals at Admission:  Personal Goals and Risk Factors at Admission - 04/14/21 1345       Core Components/Risk Factors/Patient Goals on Admission    Weight Management Weight Maintenance    Hypertension Yes    Intervention Provide education on lifestyle modifcations including regular physical activity/exercise, weight management, moderate sodium restriction and increased consumption of fresh fruit, vegetables, and low fat dairy, alcohol moderation, and smoking cessation.;Monitor prescription use compliance.    Expected Outcomes Long Term: Maintenance of blood pressure at goal levels.;Short Term: Continued assessment and intervention until BP is < 140/60mm HG in hypertensive participants. < 130/65mm HG in hypertensive participants with diabetes, heart failure or chronic kidney disease.    Lipids Yes    Intervention Provide education and support for participant on nutrition & aerobic/resistive exercise along with prescribed medications to achieve LDL 70mg , HDL >40mg .    Expected Outcomes Short Term: Participant states understanding of desired cholesterol values and is compliant with medications prescribed. Participant is following exercise prescription and nutrition guidelines.;Long Term: Cholesterol controlled with medications as prescribed, with individualized exercise RX and with personalized nutrition plan. Value goals: LDL < 70mg , HDL > 40 mg.             Core Components/Risk Factors/Patient Goals Review:    Core Components/Risk Factors/Patient Goals at Discharge (Final Review):    ITP Comments:  ITP Comments     Row Name  04/14/21 0844           ITP Comments Dr Fransico Him MD, Medical Director                Comments:Braxten attended orientation on 04/14/2021 to review rules and guidelines for program.  Completed 6 minute walk test, Intitial ITP, and exercise prescription.  Pre entry BP 104/70 heart rate 93. Blood pressure noted at 100/60 with a maximum heart rate noted at 102. Post exercise BP 92/60. Telemetry-Sinus Rhythm.  Asymptomatic. Safety measures and social distancing in place per CDC guidelines. Arseniy reports that he feels light headed almost daily at some point and time during the day.  Sitting BP 102/50. Standing BP 122/64. Will fax exercise flow sheets to Dr. Dara Hoyer office for review with today's vital signs.Barnet Pall, RN,BSN 04/14/2021 3:35 PM

## 2021-04-14 NOTE — Progress Notes (Signed)
Cardiac Rehab Medication Review by a Nurse  Does the patient  feel that his/her medications are working for him/her?  yes  Has the patient been experiencing any side effects to the medications prescribed?  no  Does the patient measure his/her own blood pressure or blood glucose at home?  yes   Does the patient have any problems obtaining medications due to transportation or finances?   no  Understanding of regimen: good Understanding of indications: good Potential of compliance: good    Nurse comments: Tyrion is taking his medication's as prescribed and has a good understanding of what his medications are for. Taquan checks his blood pressures at home 4-5 times a day.    Christa See [facility] RN 04/14/2021 8:46 AM

## 2021-04-14 NOTE — Telephone Encounter (Signed)
[name], MD  Let's stop is Toprol for now.   Notified the patient and removed from medication list.  Verbalized understanding and agreement.

## 2021-04-18 ENCOUNTER — Encounter (HOSPITAL_COMMUNITY)
Admission: RE | Admit: 2021-04-18 | Discharge: 2021-04-18 | Disposition: A | Discharge status: Home / Self Care | Payer: Medicare Other | Source: Ambulatory Visit | Attending: Internal Medicine | Admitting: Internal Medicine

## 2021-04-18 ENCOUNTER — Other Ambulatory Visit: Payer: Self-pay

## 2021-04-18 DIAGNOSIS — Z952 Presence of prosthetic heart valve: Secondary | ICD-10-CM

## 2021-04-18 DIAGNOSIS — Z48812 Encounter for surgical aftercare following surgery on the circulatory system: Secondary | ICD-10-CM | POA: Diagnosis present

## 2021-04-18 NOTE — Progress Notes (Signed)
Pt started cardiac rehab today.  Pt tolerated light exercise without difficulty. VSS, telemetry-SR - ST with frequent PACs, asymptomatic.  Medication list reconciled by Barnet Pall RN at [facility] orientation on 04-14-2021. Patient reports he did discontinue the Toprol 25 mg per Dr. Ali Lowe on 04/14/2021. Pt denies barriers to medication compliance.  PSYCHOSOCIAL ASSESSMENT:  PHQ-0. Pt exhibits positive coping skills, hopeful outlook with supportive family. No psychosocial needs identified at this time, no psychosocial interventions necessary. Pt oriented to exercise equipment and gym routine.    Understanding verbalized.

## 2021-04-20 ENCOUNTER — Other Ambulatory Visit: Payer: Self-pay

## 2021-04-20 ENCOUNTER — Encounter (HOSPITAL_COMMUNITY)
Admission: RE | Admit: 2021-04-20 | Discharge: 2021-04-20 | Disposition: A | Discharge status: Home / Self Care | Payer: Medicare Other | Source: Ambulatory Visit | Attending: Internal Medicine | Admitting: Internal Medicine

## 2021-04-20 DIAGNOSIS — Z48812 Encounter for surgical aftercare following surgery on the circulatory system: Secondary | ICD-10-CM | POA: Diagnosis not present

## 2021-04-20 DIAGNOSIS — Z952 Presence of prosthetic heart valve: Secondary | ICD-10-CM

## 2021-04-22 ENCOUNTER — Other Ambulatory Visit: Payer: Self-pay

## 2021-04-22 ENCOUNTER — Encounter (HOSPITAL_COMMUNITY)
Admission: RE | Admit: 2021-04-22 | Discharge: 2021-04-22 | Disposition: A | Discharge status: Home / Self Care | Payer: Medicare Other | Source: Ambulatory Visit | Attending: Internal Medicine | Admitting: Internal Medicine

## 2021-04-22 DIAGNOSIS — Z952 Presence of prosthetic heart valve: Secondary | ICD-10-CM

## 2021-04-22 DIAGNOSIS — Z48812 Encounter for surgical aftercare following surgery on the circulatory system: Secondary | ICD-10-CM | POA: Diagnosis not present

## 2021-04-25 ENCOUNTER — Other Ambulatory Visit: Payer: Self-pay

## 2021-04-25 ENCOUNTER — Encounter (HOSPITAL_COMMUNITY)
Admission: RE | Admit: 2021-04-25 | Discharge: 2021-04-25 | Disposition: A | Discharge status: Home / Self Care | Payer: Medicare Other | Source: Ambulatory Visit | Attending: Internal Medicine | Admitting: Internal Medicine

## 2021-04-25 DIAGNOSIS — Z952 Presence of prosthetic heart valve: Secondary | ICD-10-CM

## 2021-04-25 DIAGNOSIS — Z48812 Encounter for surgical aftercare following surgery on the circulatory system: Secondary | ICD-10-CM | POA: Diagnosis not present

## 2021-04-27 ENCOUNTER — Other Ambulatory Visit: Payer: Self-pay

## 2021-04-27 ENCOUNTER — Encounter (HOSPITAL_COMMUNITY)
Admission: RE | Admit: 2021-04-27 | Discharge: 2021-04-27 | Disposition: A | Discharge status: Home / Self Care | Payer: Medicare Other | Source: Ambulatory Visit | Attending: Internal Medicine | Admitting: Internal Medicine

## 2021-04-27 DIAGNOSIS — Z952 Presence of prosthetic heart valve: Secondary | ICD-10-CM | POA: Insufficient documentation

## 2021-04-29 ENCOUNTER — Encounter (HOSPITAL_COMMUNITY)
Admission: RE | Admit: 2021-04-29 | Discharge: 2021-04-29 | Disposition: A | Discharge status: Home / Self Care | Payer: Medicare Other | Source: Ambulatory Visit | Attending: Internal Medicine | Admitting: Internal Medicine

## 2021-04-29 ENCOUNTER — Other Ambulatory Visit: Payer: Self-pay

## 2021-04-29 DIAGNOSIS — Z952 Presence of prosthetic heart valve: Secondary | ICD-10-CM

## 2021-05-02 ENCOUNTER — Other Ambulatory Visit: Payer: Self-pay

## 2021-05-02 ENCOUNTER — Encounter (HOSPITAL_COMMUNITY)
Admission: RE | Admit: 2021-05-02 | Discharge: 2021-05-02 | Disposition: A | Discharge status: Home / Self Care | Payer: Medicare Other | Source: Ambulatory Visit | Attending: Internal Medicine | Admitting: Internal Medicine

## 2021-05-02 DIAGNOSIS — Z952 Presence of prosthetic heart valve: Secondary | ICD-10-CM

## 2021-05-02 NOTE — Progress Notes (Signed)
CARDIAC REHAB PHASE 2  Reviewed home exercise with pt today. Pt is tolerating exercise well. Pt will continue to exercise on their own by walking and doing free weights at home for 30-45 minutes per session 3 days a week in addition to the 3 days in CRP2. Advised pt on THRR, RPE scale, hydration and temperature/humidity precautions. Reinforced, S/S to stop exercise and when to call MD vs 911. Encouraged warm up cool down and stretches with exercise sessions. Pt verbalized understanding, all questions were answered and pt was given a copy to take home.    Kirby Funk ACSM-CEP 05/02/2021 11:56 AM --

## 2021-05-04 ENCOUNTER — Encounter (HOSPITAL_COMMUNITY)
Admission: RE | Admit: 2021-05-04 | Discharge: 2021-05-04 | Disposition: A | Discharge status: Home / Self Care | Payer: Medicare Other | Source: Ambulatory Visit | Attending: Internal Medicine | Admitting: Internal Medicine

## 2021-05-04 ENCOUNTER — Other Ambulatory Visit: Payer: Self-pay

## 2021-05-04 DIAGNOSIS — Z952 Presence of prosthetic heart valve: Secondary | ICD-10-CM | POA: Diagnosis not present

## 2021-05-05 ENCOUNTER — Other Ambulatory Visit: Payer: Self-pay | Admitting: Thoracic Surgery (Cardiothoracic Vascular Surgery)

## 2021-05-05 DIAGNOSIS — Z9889 Other specified postprocedural states: Secondary | ICD-10-CM

## 2021-05-06 ENCOUNTER — Encounter (HOSPITAL_COMMUNITY)
Admission: RE | Admit: 2021-05-06 | Discharge: 2021-05-06 | Disposition: A | Discharge status: Home / Self Care | Payer: Medicare Other | Source: Ambulatory Visit | Attending: Internal Medicine | Admitting: Internal Medicine

## 2021-05-06 ENCOUNTER — Other Ambulatory Visit: Payer: Self-pay

## 2021-05-06 DIAGNOSIS — Z952 Presence of prosthetic heart valve: Secondary | ICD-10-CM | POA: Diagnosis not present

## 2021-05-09 ENCOUNTER — Encounter (HOSPITAL_COMMUNITY)
Admission: RE | Admit: 2021-05-09 | Discharge: 2021-05-09 | Disposition: A | Discharge status: Home / Self Care | Payer: Medicare Other | Source: Ambulatory Visit | Attending: Internal Medicine | Admitting: Internal Medicine

## 2021-05-09 ENCOUNTER — Other Ambulatory Visit: Payer: Self-pay

## 2021-05-09 DIAGNOSIS — Z952 Presence of prosthetic heart valve: Secondary | ICD-10-CM

## 2021-05-10 NOTE — Progress Notes (Signed)
Cardiac Individual Treatment Plan  Patient Details  Name: Daniel Robinson MRN: [id number] Date of Birth: 26-Oct-1950 Referring Provider:   Flowsheet Row CARDIAC REHAB PHASE II ORIENTATION from 04/14/2021 in [place]  Referring Provider [name], MD       Initial Encounter Date:  [place] PHASE II ORIENTATION from 04/14/2021 in [facility]  Date 04/14/21       Visit Diagnosis: 02/10/21 S/P Aortic Dissection, S/P Aortic Valve Replacement, [place]  Patient's Home Medications on Admission:  Current Outpatient Medications:    aspirin 81 MG EC tablet, Take 81 mg by mouth every evening. Swallow whole., Disp: , Rfl:    azelastine (ASTELIN) 0.1 % nasal spray, Place 1-2 sprays into both nostrils 2 (two) times daily. Use in each nostril as directed (Patient taking differently: Place 1-2 sprays into both nostrils 2 (two) times daily as needed (nasal drainage). Use in each nostril as directed), Disp: 30 mL, Rfl: 0   cyclobenzaprine (FLEXERIL) 10 MG tablet, Take 10 mg by mouth 3 (three) times daily as needed for muscle spasms., Disp: , Rfl:    docusate sodium (COLACE) 100 MG capsule, Take 200 mg by mouth every evening., Disp: , Rfl:    olmesartan (BENICAR) 20 MG tablet, Take 10 mg by mouth in the morning and at bedtime., Disp: , Rfl:    simvastatin (ZOCOR) 20 MG tablet, Take 1 tablet (20 mg total) by mouth at bedtime., Disp: 90 tablet, Rfl: 3  Past Medical History: Past Medical History:  Diagnosis Date   Hyperlipidemia    Hypertension     Tobacco Use: Social History   Tobacco Use  Smoking Status Never  Smokeless Tobacco Never    Labs: Recent Review Flowsheet Data     Labs for ITP Cardiac and Pulmonary Rehab Latest Ref Rng & Units 12/16/2012 11/14/2017 03/20/2019   Cholestrol 0 - 200 mg/dL 175 220(H) 218(H)   LDLCALC 0 - 99 mg/dL 101(H) - 144(H)   LDLDIRECT mg/dL - 119.0 -   HDL  >39.00 mg/dL 38.30(L) 40.20 42.30   Trlycerides 0.0 - 149.0 mg/dL 179.0(H) 334.0(H) 156.0(H)       Capillary Blood Glucose: No results found for: GLUCAP   Exercise Target Goals: Exercise Program Goal: Individual exercise prescription set using results from initial 6 min walk test and THRR while considering  patients activity barriers and safety.   Exercise Prescription Goal: Starting with aerobic activity 30 plus minutes a day, 3 days per week for initial exercise prescription. Provide home exercise prescription and guidelines that participant acknowledges understanding prior to discharge.  Activity Barriers & Risk Stratification:  Activity Barriers & Cardiac Risk Stratification - 04/14/21 1348       Activity Barriers & Cardiac Risk Stratification   Activity Barriers Back Problems;Neck/Spine Problems;Deconditioning;Muscular Weakness;Other (comment)    Comments Dizziness    Cardiac Risk Stratification High             6 Minute Walk:  6 Minute Walk     Row Name 04/14/21 0847         6 Minute Walk   Phase Initial     Distance 1564 feet     Walk Time 6 minutes     # of Rest Breaks 0     MPH 2.96     METS 3.45     RPE 10     Perceived Dyspnea  0     VO2 Peak 12.07  Symptoms No     Resting HR 93 bpm     Resting BP 104/70     Resting Oxygen Saturation  97 %     Exercise Oxygen Saturation  during 6 min walk 100 %     Max Ex. HR 102 bpm     Max Ex. BP 100/60     2 Minute Post BP 92/60              Oxygen Initial Assessment:   Oxygen Re-Evaluation:   Oxygen Discharge (Final Oxygen Re-Evaluation):   Initial Exercise Prescription:  Initial Exercise Prescription - 04/14/21 1300       Date of Initial Exercise RX and Referring Provider   Date 04/14/21    Referring Provider [name], MD    Expected Discharge Date 06/10/21      NuStep   Level 2    SPM 80    Minutes 15    METs 2.5      Arm Ergometer   Level 1.8    Minutes 15    METs  2.3      Prescription Details   Frequency (times per week) 3    Duration Progress to 30 minutes of continuous aerobic without signs/symptoms of physical distress      Intensity   THRR 40-80% of Max Heartrate 61-122    Ratings of Perceived Exertion 11-13    Perceived Dyspnea 0-4      Progression   Progression Continue progressive overload as per policy without signs/symptoms or physical distress.      Resistance Training   Training Prescription Yes    Weight 3    Reps 10-15             Perform Capillary Blood Glucose checks as needed.  Exercise Prescription Changes:   Exercise Prescription Changes     Row Name 04/18/21 0704 05/02/21 1143           Response to Exercise   Blood Pressure (Admit) 108/56 110/62      Blood Pressure (Exercise) 108/60 118/62      Blood Pressure (Exit) 98/60 96/54      Heart Rate (Admit) 98 bpm 99 bpm      Heart Rate (Exercise) 120 bpm 126 bpm      Heart Rate (Exit) 98 bpm 102 bpm      Rating of Perceived Exertion (Exercise) 12 11.5      Symptoms None None      Comments Elevated HR at rest. Off to a good start with exercise. Reviewed MET's, goals and home ExRx      Duration Continue with 30 min of aerobic exercise without signs/symptoms of physical distress. Continue with 30 min of aerobic exercise without signs/symptoms of physical distress.      Intensity THRR unchanged THRR unchanged        Progression   Progression Continue to progress workloads to maintain intensity without signs/symptoms of physical distress. Continue to progress workloads to maintain intensity without signs/symptoms of physical distress.      Average METs 1.9 3.1        Resistance Training   Training Prescription Yes Yes      Weight 3 3      Reps 10-15 10-15      Time 10 Minutes 10 Minutes        NuStep   Level 2 3      SPM 80 100      Minutes 15 15  METs 1.7 3.5        Arm Ergometer   Level 1.8 1.8      Minutes 15 15      METs 2.2 2.7         Home Exercise Plan   Plans to continue exercise at -- Home (comment)      Frequency -- Add 3 additional days to program exercise sessions.      Initial Home Exercises Provided -- 05/02/21               Exercise Comments:   Exercise Comments     Row Name 04/18/21 0803 05/02/21 1155         Exercise Comments Patient tolerated low intensity exercise well without symptoms. Heart rate elevated at rest. Reviewed MET's, goals and home ExRx. Pt is tolerating exercise well with an average MET level 3.1. Pt feels like he is gaining progress with his goals of gaining strength and endurance, but is still working on being comfortable going on a hiking and hunting trip. Overall pt is doing very well and is increasing his MET's. Pt will add in 3 days on his own by walking and doing free weights at home.               Exercise Goals and Review:   Exercise Goals     Row Name 04/14/21 1350             Exercise Goals   Increase Physical Activity Yes       Intervention Provide advice, education, support and counseling about physical activity/exercise needs.;Develop an individualized exercise prescription for aerobic and resistive training based on initial evaluation findings, risk stratification, comorbidities and participant's personal goals.       Expected Outcomes Short Term: Attend rehab on a regular basis to increase amount of physical activity.;Long Term: Add in home exercise to make exercise part of routine and to increase amount of physical activity.;Long Term: Exercising regularly at least 3-5 days a week.       Increase Strength and Stamina Yes       Intervention Provide advice, education, support and counseling about physical activity/exercise needs.;Develop an individualized exercise prescription for aerobic and resistive training based on initial evaluation findings, risk stratification, comorbidities and participant's personal goals.       Expected Outcomes Short Term: Increase  workloads from initial exercise prescription for resistance, speed, and METs.;Short Term: Perform resistance training exercises routinely during rehab and add in resistance training at home;Long Term: Improve cardiorespiratory fitness, muscular endurance and strength as measured by increased METs and functional capacity (6MWT)       Able to understand and use rate of perceived exertion (RPE) scale Yes       Intervention Provide education and explanation on how to use RPE scale       Expected Outcomes Short Term: Able to use RPE daily in rehab to express subjective intensity level;Long Term:  Able to use RPE to guide intensity level when exercising independently       Knowledge and understanding of Target Heart Rate Range (THRR) Yes       Intervention Provide education and explanation of THRR including how the numbers were predicted and where they are located for reference       Expected Outcomes Short Term: Able to state/look up THRR;Short Term: Able to use daily as guideline for intensity in rehab       Understanding of Exercise Prescription Yes  Intervention Provide education, explanation, and written materials on patient's individual exercise prescription       Expected Outcomes Short Term: Able to explain program exercise prescription;Long Term: Able to explain home exercise prescription to exercise independently                Exercise Goals Re-Evaluation :  Exercise Goals Re-Evaluation     Row Name 04/18/21 0803 05/02/21 1147           Exercise Goal Re-Evaluation   Exercise Goals Review Increase Physical Activity;Able to understand and use rate of perceived exertion (RPE) scale Increase Physical Activity;Able to understand and use rate of perceived exertion (RPE) scale;Increase Strength and Stamina;Knowledge and understanding of Target Heart Rate Range (THRR);Able to check pulse independently;Understanding of Exercise Prescription      Comments Patient able to understand and use  RPE scale appropriately. Reviewed MET's, goals and home ExRx. Pt is tolerating exercise well with an average MET level 3.1. Pt feels like he is gaining progress with his goals of gaining strength and endurance, but is still working on being comfortable going on a hiking and hunting trip. Overall pt is doing very well and is increasing his MET's. Pt will add in 3 days on his own by walking and doing free weights at home.      Expected Outcomes Progress workloads as tolerated to help increase strength and endurance. Pt will continue to exercise on his own at home. Will continue to monitor pt and progress workloads as tolerated without sign or symptom                Discharge Exercise Prescription (Final Exercise Prescription Changes):  Exercise Prescription Changes - 05/02/21 1143       Response to Exercise   Blood Pressure (Admit) 110/62    Blood Pressure (Exercise) 118/62    Blood Pressure (Exit) 96/54    Heart Rate (Admit) 99 bpm    Heart Rate (Exercise) 126 bpm    Heart Rate (Exit) 102 bpm    Rating of Perceived Exertion (Exercise) 11.5    Symptoms None    Comments Reviewed MET's, goals and home ExRx    Duration Continue with 30 min of aerobic exercise without signs/symptoms of physical distress.    Intensity THRR unchanged      Progression   Progression Continue to progress workloads to maintain intensity without signs/symptoms of physical distress.    Average METs 3.1      Resistance Training   Training Prescription Yes    Weight 3    Reps 10-15    Time 10 Minutes      NuStep   Level 3    SPM 100    Minutes 15    METs 3.5      Arm Ergometer   Level 1.8    Minutes 15    METs 2.7      Home Exercise Plan   Plans to continue exercise at Home (comment)    Frequency Add 3 additional days to program exercise sessions.    Initial Home Exercises Provided 05/02/21             Nutrition:  Target Goals: Understanding of nutrition guidelines, daily intake of sodium  <1556m, cholesterol <2032m calories 30% from fat and 7% or less from saturated fats, daily to have 5 or more servings of fruits and vegetables.  Biometrics:  Pre Biometrics - 04/14/21 0815       Pre Biometrics   Waist Circumference  38 inches    Hip Circumference 38 inches    Waist to Hip Ratio 1 %    Triceps Skinfold 11 mm    % Body Fat 24.3 %    Grip Strength 38 kg    Flexibility 0 in   Could not reach box   Single Leg Stand 30 seconds              Nutrition Therapy Plan and Nutrition Goals:   Nutrition Assessments:  MEDIFICTS Score Key: ?70 Need to make dietary changes  40-70 Heart Healthy Diet ? 40 Therapeutic Level Cholesterol Diet   Picture Your Plate Scores: <68 Unhealthy dietary pattern with much room for improvement. 41-50 Dietary pattern unlikely to meet recommendations for good health and room for improvement. 51-60 More healthful dietary pattern, with some room for improvement.  >60 Healthy dietary pattern, although there may be some specific behaviors that could be improved.    Nutrition Goals Re-Evaluation:   Nutrition Goals Discharge (Final Nutrition Goals Re-Evaluation):   Psychosocial: Target Goals: Acknowledge presence or absence of significant depression and/or stress, maximize coping skills, provide positive support system. Participant is able to verbalize types and ability to use techniques and skills needed for reducing stress and depression.  Initial Review & Psychosocial Screening:  Initial Psych Review & Screening - 04/14/21 0845       Initial Review   Current issues with None Identified      Family Dynamics   Good Support System? Yes   Daniel Robinson has his wife and children for support     Barriers   Psychosocial barriers to participate in program There are no identifiable barriers or psychosocial needs.      Screening Interventions   Interventions Encouraged to exercise             Quality of Life Scores:  Quality of Life -  04/14/21 1337       Quality of Life   Select Quality of Life      Quality of Life Scores   Health/Function Pre 24.67 %    Socioeconomic Pre 21.64 %    Psych/Spiritual Pre 28.57 %    Family Pre 29.5 %    GLOBAL Pre 25.56 %            Scores of 19 and below usually indicate a poorer quality of life in these areas.  A difference of  2-3 points is a clinically meaningful difference.  A difference of 2-3 points in the total score of the Quality of Life Index has been associated with significant improvement in overall quality of life, self-image, physical symptoms, and general health in studies assessing change in quality of life.  PHQ-9: Recent Review Flowsheet Data     Depression screen [facility] 2/9 04/15/2021 02/21/2021 09/09/2019 09/09/2019 11/14/2017   Decreased Interest 0 0 0 0 0   Down, Depressed, Hopeless 0 0 0 0 0   PHQ - 2 Score 0 0 0 0 0   Altered sleeping - - 0 - -   Tired, decreased energy - - 0 - -   Change in appetite - - 0 - -   Feeling bad or failure about yourself  - - 0 - -   Trouble concentrating - - 0 - -   Moving slowly or fidgety/restless - - 0 - -   Suicidal thoughts - - 0 - -   PHQ-9 Score - - 0 - -   Difficult doing work/chores - - Not difficult  at all - -      Interpretation of Total Score  Total Score Depression Severity:  1-4 = Minimal depression, 5-9 = Mild depression, 10-14 = Moderate depression, 15-19 = Moderately severe depression, 20-27 = Severe depression   Psychosocial Evaluation and Intervention:   Psychosocial Re-Evaluation:  Psychosocial Re-Evaluation     [place] Name 05/10/21 1747             Psychosocial Re-Evaluation   Current issues with None Identified       Interventions Encouraged to attend Cardiac Rehabilitation for the exercise       Continue Psychosocial Services  No Follow up required                Psychosocial Discharge (Final Psychosocial Re-Evaluation):  Psychosocial Re-Evaluation - 05/10/21 1747        Psychosocial Re-Evaluation   Current issues with None Identified    Interventions Encouraged to attend Cardiac Rehabilitation for the exercise    Continue Psychosocial Services  No Follow up required             Vocational Rehabilitation: Provide vocational rehab assistance to qualifying candidates.   Vocational Rehab Evaluation & Intervention:  Vocational Rehab - 04/14/21 0847       Initial Vocational Rehab Evaluation & Intervention   Assessment shows need for Vocational Rehabilitation No   Daniel Robinson is retired and does not need vocational rehab at this time            Education: Education Goals: Education classes will be provided on a weekly basis, covering required topics. Participant will state understanding/return demonstration of topics presented.  Learning Barriers/Preferences:  Learning Barriers/Preferences - 04/14/21 1344       Learning Barriers/Preferences   Learning Barriers Sight   wears glasses   Learning Preferences Audio;Individual Instruction;Group Instruction;Verbal Instruction;Skilled Demonstration             Education Topics: Hypertension, Hypertension Reduction -Define heart disease and high blood pressure. Discus how high blood pressure affects the body and ways to reduce high blood pressure.   Exercise and Your Heart -Discuss why it is important to exercise, the FITT principles of exercise, normal and abnormal responses to exercise, and how to exercise safely.   Angina -Discuss definition of angina, causes of angina, treatment of angina, and how to decrease risk of having angina.   Cardiac Medications -Review what the following cardiac medications are used for, how they affect the body, and side effects that may occur when taking the medications.  Medications include Aspirin, Beta blockers, calcium channel blockers, ACE Inhibitors, angiotensin receptor blockers, diuretics, digoxin, and antihyperlipidemics.   Congestive Heart  Failure -Discuss the definition of CHF, how to live with CHF, the signs and symptoms of CHF, and how keep track of weight and sodium intake.   Heart Disease and Intimacy -Discus the effect sexual activity has on the heart, how changes occur during intimacy as we age, and safety during sexual activity.   Smoking Cessation / COPD -Discuss different methods to quit smoking, the health benefits of quitting smoking, and the definition of COPD.   Nutrition I: Fats -Discuss the types of cholesterol, what cholesterol does to the heart, and how cholesterol levels can be controlled.   Nutrition II: Labels -Discuss the different components of food labels and how to read food label   Heart Parts/Heart Disease and PAD -Discuss the anatomy of the heart, the pathway of blood circulation through the heart, and these are affected by heart disease.  Stress I: Signs and Symptoms -Discuss the causes of stress, how stress may lead to anxiety and depression, and ways to limit stress.   Stress II: Relaxation -Discuss different types of relaxation techniques to limit stress.   Warning Signs of Stroke / TIA -Discuss definition of a stroke, what the signs and symptoms are of a stroke, and how to identify when someone is having stroke.   Knowledge Questionnaire Score:  Knowledge Questionnaire Score - 04/14/21 1337       Knowledge Questionnaire Score   Pre Score 21/24             Core Components/Risk Factors/Patient Goals at Admission:  Personal Goals and Risk Factors at Admission - 04/14/21 1345       Core Components/Risk Factors/Patient Goals on Admission    Weight Management Weight Maintenance    Hypertension Yes    Intervention Provide education on lifestyle modifcations including regular physical activity/exercise, weight management, moderate sodium restriction and increased consumption of fresh fruit, vegetables, and low fat dairy, alcohol moderation, and smoking cessation.;Monitor  prescription use compliance.    Expected Outcomes Long Term: Maintenance of blood pressure at goal levels.;Short Term: Continued assessment and intervention until BP is < 140/100m HG in hypertensive participants. < 130/874mHG in hypertensive participants with diabetes, heart failure or chronic kidney disease.    Lipids Yes    Intervention Provide education and support for participant on nutrition & aerobic/resistive exercise along with prescribed medications to achieve LDL <7082mHDL >77m10m  Expected Outcomes Short Term: Participant states understanding of desired cholesterol values and is compliant with medications prescribed. Participant is following exercise prescription and nutrition guidelines.;Long Term: Cholesterol controlled with medications as prescribed, with individualized exercise RX and with personalized nutrition plan. Value goals: LDL < 70mg51mL > 40 mg.             Core Components/Risk Factors/Patient Goals Review:   Goals and Risk Factor Review     Row Name 05/10/21 1747             Core Components/Risk Factors/Patient Goals Review   Personal Goals Review Weight Management/Obesity;Hypertension;Lipids       Review KeithAregbeen doing well with exercise. Some resting exertional BP's noted in the 90's will continue to monitor.       Expected Outcomes KeithZayed continue to participate in phase 2 cardiac rehab for exercise, nutrition and lifestyle modifications                Core Components/Risk Factors/Patient Goals at Discharge (Final Review):   Goals and Risk Factor Review - 05/10/21 1747       Core Components/Risk Factors/Patient Goals Review   Personal Goals Review Weight Management/Obesity;Hypertension;Lipids    Review KeithReginaldbeen doing well with exercise. Some resting exertional BP's noted in the 90's will continue to monitor.    Expected Outcomes KeithKorion continue to participate in phase 2 cardiac rehab for exercise, nutrition and lifestyle  modifications             ITP Comments:  ITP Comments     Row Name 04/14/21 0844 05/10/21 1745         ITP Comments Dr TraciFransico HimMedical Director 30 Day ITP Review. KetihCollins Scotlandgood attendance and participation in phase 2 cardiac rehab. KetihCollins Scotlandoing well with exercise.               Comments: See ITP comments.Daniel GaveSN

## 2021-05-11 ENCOUNTER — Encounter (HOSPITAL_COMMUNITY)
Admission: RE | Admit: 2021-05-11 | Discharge: 2021-05-11 | Disposition: A | Discharge status: Home / Self Care | Payer: Medicare Other | Source: Ambulatory Visit | Attending: Internal Medicine | Admitting: Internal Medicine

## 2021-05-11 ENCOUNTER — Other Ambulatory Visit: Payer: Self-pay

## 2021-05-11 DIAGNOSIS — Z952 Presence of prosthetic heart valve: Secondary | ICD-10-CM

## 2021-05-13 ENCOUNTER — Other Ambulatory Visit: Payer: Self-pay

## 2021-05-13 ENCOUNTER — Encounter (HOSPITAL_COMMUNITY)
Admission: RE | Admit: 2021-05-13 | Discharge: 2021-05-13 | Disposition: A | Discharge status: Home / Self Care | Payer: Medicare Other | Source: Ambulatory Visit | Attending: Internal Medicine | Admitting: Internal Medicine

## 2021-05-13 DIAGNOSIS — Z952 Presence of prosthetic heart valve: Secondary | ICD-10-CM | POA: Diagnosis not present

## 2021-05-13 MED ORDER — OLMESARTAN MEDOXOMIL 20 MG PO TABS: 20.0000 mg | ORAL_TABLET | Freq: Every day | ORAL | 1 refills | Status: DC

## 2021-05-16 ENCOUNTER — Other Ambulatory Visit: Payer: Self-pay

## 2021-05-16 ENCOUNTER — Other Ambulatory Visit: Payer: Self-pay | Admitting: Family Medicine

## 2021-05-16 ENCOUNTER — Encounter (HOSPITAL_COMMUNITY)
Admission: RE | Admit: 2021-05-16 | Discharge: 2021-05-16 | Disposition: A | Discharge status: Home / Self Care | Payer: Medicare Other | Source: Ambulatory Visit | Attending: Internal Medicine | Admitting: Internal Medicine

## 2021-05-16 DIAGNOSIS — Z952 Presence of prosthetic heart valve: Secondary | ICD-10-CM

## 2021-05-18 ENCOUNTER — Encounter (HOSPITAL_COMMUNITY)
Admission: RE | Admit: 2021-05-18 | Discharge: 2021-05-18 | Disposition: A | Discharge status: Home / Self Care | Payer: Medicare Other | Source: Ambulatory Visit | Attending: Internal Medicine | Admitting: Internal Medicine

## 2021-05-18 ENCOUNTER — Other Ambulatory Visit: Payer: Self-pay

## 2021-05-18 DIAGNOSIS — Z952 Presence of prosthetic heart valve: Secondary | ICD-10-CM | POA: Diagnosis not present

## 2021-05-20 ENCOUNTER — Encounter (HOSPITAL_COMMUNITY)
Admission: RE | Admit: 2021-05-20 | Discharge: 2021-05-20 | Disposition: A | Discharge status: Home / Self Care | Payer: Medicare Other | Source: Ambulatory Visit | Attending: Internal Medicine | Admitting: Internal Medicine

## 2021-05-20 ENCOUNTER — Other Ambulatory Visit: Payer: Self-pay

## 2021-05-20 DIAGNOSIS — Z952 Presence of prosthetic heart valve: Secondary | ICD-10-CM | POA: Diagnosis not present

## 2021-05-23 ENCOUNTER — Other Ambulatory Visit: Payer: Self-pay

## 2021-05-23 ENCOUNTER — Encounter (HOSPITAL_COMMUNITY)
Admission: RE | Admit: 2021-05-23 | Discharge: 2021-05-23 | Disposition: A | Discharge status: Home / Self Care | Payer: Medicare Other | Source: Ambulatory Visit | Attending: Internal Medicine | Admitting: Internal Medicine

## 2021-05-23 DIAGNOSIS — Z952 Presence of prosthetic heart valve: Secondary | ICD-10-CM | POA: Diagnosis not present

## 2021-05-24 ENCOUNTER — Other Ambulatory Visit: Payer: Self-pay | Admitting: Family Medicine

## 2021-05-25 ENCOUNTER — Other Ambulatory Visit: Payer: Self-pay

## 2021-05-25 ENCOUNTER — Encounter (HOSPITAL_COMMUNITY)
Admission: RE | Admit: 2021-05-25 | Discharge: 2021-05-25 | Disposition: A | Discharge status: Home / Self Care | Payer: Medicare Other | Source: Ambulatory Visit | Attending: Internal Medicine | Admitting: Internal Medicine

## 2021-05-25 DIAGNOSIS — Z9889 Other specified postprocedural states: Secondary | ICD-10-CM | POA: Insufficient documentation

## 2021-05-25 DIAGNOSIS — Z48812 Encounter for surgical aftercare following surgery on the circulatory system: Secondary | ICD-10-CM | POA: Diagnosis present

## 2021-05-25 DIAGNOSIS — Z952 Presence of prosthetic heart valve: Secondary | ICD-10-CM | POA: Insufficient documentation

## 2021-05-27 ENCOUNTER — Other Ambulatory Visit: Payer: Self-pay

## 2021-05-27 ENCOUNTER — Encounter (HOSPITAL_COMMUNITY)
Admission: RE | Admit: 2021-05-27 | Discharge: 2021-05-27 | Disposition: A | Discharge status: Home / Self Care | Payer: Medicare Other | Source: Ambulatory Visit | Attending: Internal Medicine | Admitting: Internal Medicine

## 2021-05-27 DIAGNOSIS — Z952 Presence of prosthetic heart valve: Secondary | ICD-10-CM

## 2021-05-27 DIAGNOSIS — Z48812 Encounter for surgical aftercare following surgery on the circulatory system: Secondary | ICD-10-CM | POA: Diagnosis not present

## 2021-05-30 ENCOUNTER — Other Ambulatory Visit: Payer: Medicare Other | Admitting: *Deleted

## 2021-05-30 ENCOUNTER — Encounter (HOSPITAL_COMMUNITY)
Admission: RE | Admit: 2021-05-30 | Discharge: 2021-05-30 | Disposition: A | Discharge status: Home / Self Care | Payer: Medicare Other | Source: Ambulatory Visit | Attending: Internal Medicine | Admitting: Internal Medicine

## 2021-05-30 ENCOUNTER — Other Ambulatory Visit: Payer: Self-pay

## 2021-05-30 DIAGNOSIS — Z952 Presence of prosthetic heart valve: Secondary | ICD-10-CM

## 2021-05-30 DIAGNOSIS — I10 Essential (primary) hypertension: Secondary | ICD-10-CM

## 2021-05-30 DIAGNOSIS — E782 Mixed hyperlipidemia: Secondary | ICD-10-CM

## 2021-05-30 DIAGNOSIS — Z48812 Encounter for surgical aftercare following surgery on the circulatory system: Secondary | ICD-10-CM | POA: Diagnosis not present

## 2021-05-30 DIAGNOSIS — Z95828 Presence of other vascular implants and grafts: Secondary | ICD-10-CM

## 2021-05-30 DIAGNOSIS — I4891 Unspecified atrial fibrillation: Secondary | ICD-10-CM

## 2021-05-30 LAB — HEPATIC FUNCTION PANEL
ALT: 12 IU/L (ref 0–44)
AST: 16 IU/L (ref 0–40)
Albumin: 4.1 g/dL (ref 3.8–4.8)
Alkaline Phosphatase: 62 IU/L (ref 44–121)
Bilirubin Total: 0.3 mg/dL (ref 0.0–1.2)
Bilirubin, Direct: <0.1 mg/dL (ref 0.00–0.40)
Total Protein: 6.8 g/dL (ref 6.0–8.5)

## 2021-05-30 LAB — LIPID PANEL
Chol/HDL Ratio: 4.1 ratio (ref 0.0–5.0)
Cholesterol, Total: 179 mg/dL (ref 100–199)
HDL: 44 mg/dL (ref >39)
LDL Chol Calc (NIH): 112 mg/dL — ABNORMAL HIGH (ref 0–99)
Triglycerides: 128 mg/dL (ref 0–149)
VLDL Cholesterol Cal: 23 mg/dL (ref 5–40)

## 2021-05-31 ENCOUNTER — Telehealth: Payer: Self-pay | Admitting: *Deleted

## 2021-05-31 DIAGNOSIS — E782 Mixed hyperlipidemia: Secondary | ICD-10-CM

## 2021-05-31 MED ORDER — ATORVASTATIN CALCIUM 40 MG PO TABS: 40.0000 mg | ORAL_TABLET | Freq: Every day | ORAL | 3 refills | Status: DC

## 2021-05-31 NOTE — Telephone Encounter (Signed)
Reviewed with patient his results.  He will stop simvastatin.  Begin atorvastatin 40 mg and return to lab on May 5 for fasting lipids/liver function.  Pt voices understanding and agreement. ?

## 2021-05-31 NOTE — Telephone Encounter (Signed)
-----   Message from [name], MD sent at 05/31/2021  1:02 PM EST ----- ?LDL is too high, let's stop simvastatin and start atorvastatin '40mg'$ ; FLP and LFTs in 2 months. ?

## 2021-06-01 ENCOUNTER — Other Ambulatory Visit: Payer: Self-pay

## 2021-06-01 ENCOUNTER — Encounter (HOSPITAL_COMMUNITY)
Admission: RE | Admit: 2021-06-01 | Discharge: 2021-06-01 | Disposition: A | Discharge status: Home / Self Care | Payer: Medicare Other | Source: Ambulatory Visit | Attending: Internal Medicine | Admitting: Internal Medicine

## 2021-06-01 DIAGNOSIS — Z952 Presence of prosthetic heart valve: Secondary | ICD-10-CM

## 2021-06-01 DIAGNOSIS — Z48812 Encounter for surgical aftercare following surgery on the circulatory system: Secondary | ICD-10-CM | POA: Diagnosis not present

## 2021-06-02 ENCOUNTER — Telehealth (HOSPITAL_COMMUNITY): Payer: Self-pay

## 2021-06-02 NOTE — Telephone Encounter (Signed)
-----   Message from [name], MD sent at 06/02/2021  6:56 AM EST ----- ?Regarding: RE: THRR increase ?Sounds good thanks. ?----- Message ----- ?From: Esmeralda Links S ?Sent: 06/01/2021   4:30 PM EST ?To: [name], MD ?Subject: THRR increase                                 ? ?Daniel Robinson has been in rehab approximately 7 weeks and is doing well.  Blood pressure is within normal limits, but exercise heart rates are beginning to exceed Target Heart Rate Range(THRR) of 61-122 bpm (40%-80% of age predicted max HR).  If no GXT is planned for the near future and MD agrees, request to increase THR to 40% -90% of age predicted max HR 61-137 bpm.  ? ? ? ?

## 2021-06-03 ENCOUNTER — Encounter (HOSPITAL_COMMUNITY)
Admission: RE | Admit: 2021-06-03 | Discharge: 2021-06-03 | Disposition: A | Discharge status: Home / Self Care | Payer: Medicare Other | Source: Ambulatory Visit | Attending: Internal Medicine | Admitting: Internal Medicine

## 2021-06-03 ENCOUNTER — Other Ambulatory Visit: Payer: Self-pay

## 2021-06-03 DIAGNOSIS — Z952 Presence of prosthetic heart valve: Secondary | ICD-10-CM

## 2021-06-03 DIAGNOSIS — Z48812 Encounter for surgical aftercare following surgery on the circulatory system: Secondary | ICD-10-CM | POA: Diagnosis not present

## 2021-06-06 ENCOUNTER — Other Ambulatory Visit: Payer: Self-pay

## 2021-06-06 ENCOUNTER — Encounter (HOSPITAL_COMMUNITY)
Admission: RE | Admit: 2021-06-06 | Discharge: 2021-06-06 | Disposition: A | Discharge status: Home / Self Care | Payer: Medicare Other | Source: Ambulatory Visit | Attending: Internal Medicine | Admitting: Internal Medicine

## 2021-06-06 DIAGNOSIS — Z952 Presence of prosthetic heart valve: Secondary | ICD-10-CM

## 2021-06-06 DIAGNOSIS — Z48812 Encounter for surgical aftercare following surgery on the circulatory system: Secondary | ICD-10-CM | POA: Diagnosis not present

## 2021-06-07 NOTE — Progress Notes (Signed)
Cardiac Individual Treatment Plan ? ?Patient Details  ?Name: Daniel Robinson ?MRN: [id number] ?Date of Birth: 06/09/1950 ?Referring Provider:   ?Flowsheet Row CARDIAC REHAB PHASE II ORIENTATION from 04/14/2021 in [facility]  ?Referring Provider [name], MD  ? ?  ? ? ?Initial Encounter Date:  ?Flowsheet Row CARDIAC REHAB PHASE II ORIENTATION from 04/14/2021 in [facility]  ?Date 04/14/21  ? ?  ? ? ?Visit Diagnosis: 02/10/21 S/P Aortic Dissection, S/P Aortic Valve Replacement, [place] ? ?Patient's Home Medications on Admission: ? ?Current Outpatient Medications:  ?  aspirin 81 MG EC tablet, Take 81 mg by mouth every evening. Swallow whole., Disp: , Rfl:  ?  atorvastatin (LIPITOR) 40 MG tablet, Take 1 tablet (40 mg total) by mouth daily., Disp: 90 tablet, Rfl: 3 ?  Azelastine HCl 137 MCG/SPRAY SOLN, PLACE 1-2 SPRAYS INTO BOTH NOSTRILS 2 (TWO) TIMES DAILY., Disp: 30 mL, Rfl: 2 ?  cyclobenzaprine (FLEXERIL) 10 MG tablet, Take 10 mg by mouth 3 (three) times daily as needed for muscle spasms., Disp: , Rfl:  ?  docusate sodium (COLACE) 100 MG capsule, Take 200 mg by mouth every evening., Disp: , Rfl:  ?  olmesartan (BENICAR) 20 MG tablet, Take 1 tablet (20 mg total) by mouth daily., Disp: 90 tablet, Rfl: 1 ? ?Past Medical History: ?Past Medical History:  ?Diagnosis Date  ? Hyperlipidemia   ? Hypertension   ? ? ?Tobacco Use: ?Social History  ? ?Tobacco Use  ?Smoking Status Never  ?Smokeless Tobacco Never  ? ? ?Labs: ?Recent Review Flowsheet Data   ? ? Labs for ITP Cardiac and Pulmonary Rehab Latest Ref Rng & Units 12/16/2012 11/14/2017 03/20/2019 05/30/2021  ? Cholestrol 100 - 199 mg/dL 175 220(H) 218(H) 179  ? LDLCALC 0 - 99 mg/dL 101(H) - 144(H) 112(H)  ? LDLDIRECT mg/dL - 119.0 - -  ? HDL >39 mg/dL 38.30(L) 40.20 42.30 44  ? Trlycerides 0 - 149 mg/dL 179.0(H) 334.0(H) 156.0(H) 128  ? ?  ? ? ?Capillary Blood Glucose: ?No results found for:  GLUCAP ? ? ?Exercise Target Goals: ?Exercise Program Goal: ?Individual exercise prescription set using results from initial 6 min walk test and THRR while considering  patient?s activity barriers and safety.  ? ?Exercise Prescription Goal: ?Starting with aerobic activity 30 plus minutes a day, 3 days per week for initial exercise prescription. Provide home exercise prescription and guidelines that participant acknowledges understanding prior to discharge. ? ?Activity Barriers & Risk Stratification: ? Activity Barriers & Cardiac Risk Stratification - 04/14/21 1348   ? ?  ? Activity Barriers & Cardiac Risk Stratification  ? Activity Barriers Back Problems;Neck/Spine Problems;Deconditioning;Muscular Weakness;Other (comment)   ? Comments Dizziness   ? Cardiac Risk Stratification High   ? ?  ?  ? ?  ? ? ?6 Minute Walk: ? 6 Minute Walk   ? ? [place] Name 04/14/21 0847  ?  ?  ?  ? 6 Minute Walk  ? Phase Initial    ? Distance 1564 feet    ? Walk Time 6 minutes    ? # of Rest Breaks 0    ? MPH 2.96    ? METS 3.45    ? RPE 10    ? Perceived Dyspnea  0    ? VO2 Peak 12.07    ? Symptoms No    ? Resting HR 93 bpm    ? Resting BP 104/70    ? Resting  Oxygen Saturation  97 %    ? Exercise Oxygen Saturation  during 6 min walk 100 %    ? Max Ex. HR 102 bpm    ? Max Ex. BP 100/60    ? 2 Minute Post BP 92/60    ? ?  ?  ? ?  ? ? ?Oxygen Initial Assessment: ? ? ?Oxygen Re-Evaluation: ? ? ?Oxygen Discharge (Final Oxygen Re-Evaluation): ? ? ?Initial Exercise Prescription: ? Initial Exercise Prescription - 04/14/21 1300   ? ?  ? Date of Initial Exercise RX and Referring Provider  ? Date 04/14/21   ? Referring Provider [name], MD   ? Expected Discharge Date 06/10/21   ?  ? NuStep  ? Level 2   ? SPM 80   ? Minutes 15   ? METs 2.5   ?  ? Arm Ergometer  ? Level 1.8   ? Minutes 15   ? METs 2.3   ?  ? Prescription Details  ? Frequency (times per week) 3   ? Duration Progress to 30 minutes of continuous aerobic without signs/symptoms of  physical distress   ?  ? Intensity  ? THRR 40-80% of Max Heartrate 61-122   ? Ratings of Perceived Exertion 11-13   ? Perceived Dyspnea 0-4   ?  ? Progression  ? Progression Continue progressive overload as per policy without signs/symptoms or physical distress.   ?  ? Resistance Training  ? Training Prescription Yes   ? Weight 3   ? Reps 10-15   ? ?  ?  ? ?  ? ? ?Perform Capillary Blood Glucose checks as needed. ? ?Exercise Prescription Changes: ? ? Exercise Prescription Changes   ? ? [name] 04/18/21 [phone] 05/02/21 1143 05/16/21 0830 06/01/21 0948  ?  ?  ? Response to Exercise  ? Blood Pressure (Admit) 108/56 110/62 108/70 136/68   ? Blood Pressure (Exercise) 108/60 118/62 142/68 160/76   ? Blood Pressure (Exit) '98/60 96/54 98/64 ' 96/62   ? Heart Rate (Admit) 98 bpm 99 bpm 78 bpm 90 bpm   ? Heart Rate (Exercise) 120 bpm 126 bpm 122 bpm 129 bpm   ? Heart Rate (Exit) 98 bpm 102 bpm 95 bpm 99 bpm   ? Rating of Perceived Exertion (Exercise) 12 11.'5 12 13   ' ? Symptoms None None None None   ? Comments Elevated HR at rest. Off to a good start with exercise. Reviewed MET's, goals and home ExRx Reviewed MET's Reviewed MET's and goals   ? Duration Continue with 30 min of aerobic exercise without signs/symptoms of physical distress. Continue with 30 min of aerobic exercise without signs/symptoms of physical distress. Continue with 30 min of aerobic exercise without signs/symptoms of physical distress. Continue with 30 min of aerobic exercise without signs/symptoms of physical distress.   ? Intensity THRR unchanged THRR unchanged THRR unchanged THRR unchanged   ?  ? Progression  ? Progression Continue to progress workloads to maintain intensity without signs/symptoms of physical distress. Continue to progress workloads to maintain intensity without signs/symptoms of physical distress. Continue to progress workloads to maintain intensity without signs/symptoms of physical distress. Continue to progress workloads to maintain  intensity without signs/symptoms of physical distress.   ? Average METs 1.9 3.1 3.25 3.7   ?  ? Resistance Training  ? Training Prescription Yes Yes Yes No   ? Weight '3 3 4 ' --   ? Reps 10-15 10-15 10-15 --   ? Time 10  Minutes 10 Minutes 10 Minutes --   ?  ? NuStep  ? Level '2 3 3 3   ' ? SPM 80 100 100 100   ? Minutes '15 15 15 15   ' ? METs 1.7 3.5 3.5 4.1   ?  ? Arm Ergometer  ? Level 1.8 1.8 2.2 2.2   ? Minutes '15 15 15 15   ' ? METs 2.2 2.7 3 3.3   ?  ? Home Exercise Plan  ? Plans to continue exercise at -- Home (comment) Home (comment) Home (comment)   ? Frequency -- Add 3 additional days to program exercise sessions. Add 3 additional days to program exercise sessions. Add 3 additional days to program exercise sessions.   ? Initial Home Exercises Provided -- 05/02/21 05/02/21 05/02/21   ? ?  ?  ? ?  ? ? ?Exercise Comments: ? ? Exercise Comments   ? ? [place] Name 04/18/21 0803 05/02/21 1155 05/16/21 0830 06/01/21 0830  ?  ? Exercise Comments Patient tolerated low intensity exercise well without symptoms. Heart rate elevated at rest. Reviewed MET's, goals and home ExRx. Pt is tolerating exercise well with an average MET level 3.1. Pt feels like he is gaining progress with his goals of gaining strength and endurance, but is still working on being comfortable going on a hiking and hunting trip. Overall pt is doing very well and is increasing his MET's. Pt will add in 3 days on his own by walking and doing free weights at home. Reviewed MET's. Pt is tolerating exercise well with an average MET level 3.25. Will continue to monitor pt and progress workloads as tolerated without sign or symptom. Reviewed MET's, and goals. Pt is tolerating exercise well with an average MET level 3.7. Pt feels good about gaining strength and endurance, but is still working on being comfortable going on a hiking and hunting trip, pt states it will just take more time to build up tolerance, but he is happy that he does not feel like he's plateaud,  but is steadily increasing strength and endurance. Pt is also happy to have more control of his resting HR and BP, they have both become less elevated.   ? ?  ?  ? ?  ? ? ?Exercise Goals and Review: ? ? Exercise Goals   ?

## 2021-06-08 ENCOUNTER — Other Ambulatory Visit: Payer: Self-pay

## 2021-06-08 ENCOUNTER — Encounter (HOSPITAL_COMMUNITY)
Admission: RE | Admit: 2021-06-08 | Discharge: 2021-06-08 | Disposition: A | Discharge status: Home / Self Care | Payer: Medicare Other | Source: Ambulatory Visit | Attending: Internal Medicine | Admitting: Internal Medicine

## 2021-06-08 VITALS — Ht 69.25 in | Wt 177.9 lb

## 2021-06-08 DIAGNOSIS — Z48812 Encounter for surgical aftercare following surgery on the circulatory system: Secondary | ICD-10-CM | POA: Diagnosis not present

## 2021-06-08 DIAGNOSIS — Z952 Presence of prosthetic heart valve: Secondary | ICD-10-CM

## 2021-06-10 ENCOUNTER — Encounter (HOSPITAL_COMMUNITY)
Admission: RE | Admit: 2021-06-10 | Discharge: 2021-06-10 | Disposition: A | Discharge status: Home / Self Care | Payer: Medicare Other | Source: Ambulatory Visit | Attending: Internal Medicine | Admitting: Internal Medicine

## 2021-06-10 ENCOUNTER — Other Ambulatory Visit: Payer: Self-pay

## 2021-06-10 DIAGNOSIS — Z952 Presence of prosthetic heart valve: Secondary | ICD-10-CM

## 2021-06-10 DIAGNOSIS — Z48812 Encounter for surgical aftercare following surgery on the circulatory system: Secondary | ICD-10-CM | POA: Diagnosis not present

## 2021-06-13 ENCOUNTER — Encounter (HOSPITAL_COMMUNITY)
Admission: RE | Admit: 2021-06-13 | Discharge: 2021-06-13 | Disposition: A | Discharge status: Home / Self Care | Payer: Medicare Other | Source: Ambulatory Visit | Attending: Internal Medicine | Admitting: Internal Medicine

## 2021-06-13 ENCOUNTER — Ambulatory Visit (INDEPENDENT_AMBULATORY_CARE_PROVIDER_SITE_OTHER): Payer: Medicare Other

## 2021-06-13 ENCOUNTER — Other Ambulatory Visit: Payer: Self-pay

## 2021-06-13 VITALS — Ht 69.0 in | Wt 175.0 lb

## 2021-06-13 DIAGNOSIS — Z952 Presence of prosthetic heart valve: Secondary | ICD-10-CM

## 2021-06-13 DIAGNOSIS — Z0000 Encounter for general adult medical examination without abnormal findings: Secondary | ICD-10-CM

## 2021-06-13 DIAGNOSIS — Z48812 Encounter for surgical aftercare following surgery on the circulatory system: Secondary | ICD-10-CM | POA: Diagnosis not present

## 2021-06-13 NOTE — Patient Instructions (Addendum)
?[name] , ?Thank you for taking time to come for your Medicare Wellness Visit. I appreciate your ongoing commitment to your health goals. Please review the following plan we discussed and let me know if I can assist you in the future.  ? ?These are the goals we discussed: ? Goals   ? ?   Increase physical activity (pt-stated)   ?   Stay healthy ?  ? ?  ?  ?This is a list of the screening recommended for you and due dates:  ?Health Maintenance  ?Topic Date Due  ? Flu Shot  06/24/2021*  ? COVID-19 Vaccine (1) 06/29/2021*  ? Zoster (Shingles) Vaccine (1 of 2) 09/13/2021*  ? Pneumonia Vaccine (2 - PCV) 06/14/2022*  ? Colon Cancer Screening  06/14/2022*  ? Tetanus Vaccine  12/21/2022  ? Hepatitis C Screening: USPSTF Recommendation to screen - Ages 60-79 yo.  Completed  ? HPV Vaccine  Aged Out  ?*Topic was postponed. The date shown is not the original due date.  ? ?Advanced directives: No Patient deferred ? ?Conditions/risks identified: None ? ?Next appointment: Follow up in one year for your annual wellness visit.  ? ?Preventive Care 69 Years and Older, Male ?Preventive care refers to lifestyle choices and visits with your health care provider that can promote health and wellness. ?What does preventive care include? ?A yearly physical exam. This is also called an annual well check. ?Dental exams once or twice a year. ?Routine eye exams. Ask your health care provider how often you should have your eyes checked. ?Personal lifestyle choices, including: ?Daily care of your teeth and gums. ?Regular physical activity. ?Eating a healthy diet. ?Avoiding tobacco and drug use. ?Limiting alcohol use. ?Practicing safe sex. ?Taking low doses of aspirin every day. ?Taking vitamin and mineral supplements as recommended by your health care provider. ?What happens during an annual well check? ?The services and screenings done by your health care provider during your annual well check will depend on your age, overall health,  lifestyle risk factors, and family history of disease. ?Counseling  ?Your health care provider may ask you questions about your: ?Alcohol use. ?Tobacco use. ?Drug use. ?Emotional well-being. ?Home and relationship well-being. ?Sexual activity. ?Eating habits. ?History of falls. ?Memory and ability to understand (cognition). ?Work and work Statistician. ?Screening  ?You may have the following tests or measurements: ?Height, weight, and BMI. ?Blood pressure. ?Lipid and cholesterol levels. These may be checked every 5 years, or more frequently if you are over 61 years old. ?Skin check. ?Lung cancer screening. You may have this screening every year starting at age 63 if you have a 30-pack-year history of smoking and currently smoke or have quit within the past 15 years. ?Fecal occult blood test (FOBT) of the stool. You may have this test every year starting at age 69. ?Flexible sigmoidoscopy or colonoscopy. You may have a sigmoidoscopy every 5 years or a colonoscopy every 10 years starting at age 69. ?Prostate cancer screening. Recommendations will vary depending on your family history and other risks. ?Hepatitis C blood test. ?Hepatitis B blood test. ?Sexually transmitted disease (STD) testing. ?Diabetes screening. This is done by checking your blood sugar (glucose) after you have not eaten for a while (fasting). You may have this done every 1-3 years. ?Abdominal aortic aneurysm (AAA) screening. You may need this if you are a current or former smoker. ?Osteoporosis. You may be screened starting at age 64 if you are at high risk. ?Talk with your health care provider about  your test results, treatment options, and if necessary, the need for more tests. ?Vaccines  ?Your health care provider may recommend certain vaccines, such as: ?Influenza vaccine. This is recommended every year. ?Tetanus, diphtheria, and acellular pertussis (Tdap, Td) vaccine. You may need a Td booster every 10 years. ?Zoster vaccine. You may need this  after age 34. ?Pneumococcal 13-valent conjugate (PCV13) vaccine. One dose is recommended after age 22. ?Pneumococcal polysaccharide (PPSV23) vaccine. One dose is recommended after age 7. ?Talk to your health care provider about which screenings and vaccines you need and how often you need them. ?This information is not intended to replace advice given to you by your health care provider. Make sure you discuss any questions you have with your health care provider. ?Document Released: 04/09/2015 Document Revised: 12/01/2015 Document Reviewed: 01/12/2015 ?Elsevier Interactive Patient Education ? 2017 [place]. ? ?Fall Prevention in the Home ?Falls can cause injuries. They can happen to people of all ages. There are many things you can do to make your home safe and to help prevent falls. ?What can I do on the outside of my home? ?Regularly fix the edges of walkways and driveways and fix any cracks. ?Remove anything that might make you trip as you walk through a door, such as a raised step or threshold. ?Trim any bushes or trees on the path to your home. ?Use bright outdoor lighting. ?Clear any walking paths of anything that might make someone trip, such as rocks or tools. ?Regularly check to see if handrails are loose or broken. Make sure that both sides of any steps have handrails. ?Any raised decks and porches should have guardrails on the edges. ?Have any leaves, snow, or ice cleared regularly. ?Use sand or salt on walking paths during winter. ?Clean up any spills in your garage right away. This includes oil or grease spills. ?What can I do in the bathroom? ?Use night lights. ?Install grab bars by the toilet and in the tub and shower. Do not use towel bars as grab bars. ?Use non-skid mats or decals in the tub or shower. ?If you need to sit down in the shower, use a plastic, non-slip stool. ?Keep the floor dry. Clean up any water that spills on the floor as soon as it happens. ?Remove soap buildup in the tub or  shower regularly. ?Attach bath mats securely with double-sided non-slip rug tape. ?Do not have throw rugs and other things on the floor that can make you trip. ?What can I do in the bedroom? ?Use night lights. ?Make sure that you have a light by your bed that is easy to reach. ?Do not use any sheets or blankets that are too big for your bed. They should not hang down onto the floor. ?Have a firm chair that has side arms. You can use this for support while you get dressed. ?Do not have throw rugs and other things on the floor that can make you trip. ?What can I do in the kitchen? ?Clean up any spills right away. ?Avoid walking on wet floors. ?Keep items that you use a lot in easy-to-reach places. ?If you need to reach something above you, use a strong step stool that has a grab bar. ?Keep electrical cords out of the way. ?Do not use floor polish or wax that makes floors slippery. If you must use wax, use non-skid floor wax. ?Do not have throw rugs and other things on the floor that can make you trip. ?What can I do with  my stairs? ?Do not leave any items on the stairs. ?Make sure that there are handrails on both sides of the stairs and use them. Fix handrails that are broken or loose. Make sure that handrails are as long as the stairways. ?Check any carpeting to make sure that it is firmly attached to the stairs. Fix any carpet that is loose or worn. ?Avoid having throw rugs at the top or bottom of the stairs. If you do have throw rugs, attach them to the floor with carpet tape. ?Make sure that you have a light switch at the top of the stairs and the bottom of the stairs. If you do not have them, ask someone to add them for you. ?What else can I do to help prevent falls? ?Wear shoes that: ?Do not have high heels. ?Have rubber bottoms. ?Are comfortable and fit you well. ?Are closed at the toe. Do not wear sandals. ?If you use a stepladder: ?Make sure that it is fully opened. Do not climb a closed stepladder. ?Make  sure that both sides of the stepladder are locked into place. ?Ask someone to hold it for you, if possible. ?Clearly mark and make sure that you can see: ?Any grab bars or handrails. ?First and last steps. ?W

## 2021-06-13 NOTE — Progress Notes (Signed)
? ?Subjective:  ? Daniel Robinson is a 71 y.o. male who presents for Medicare Annual/Subsequent preventive examination. ? ?Review of Systems    ?Virtual Visit via Telephone Note ? ?I connected with  Daniel Robinson on 06/13/21 at 11:15 AM EDT by telephone and verified that I am speaking with the correct person using two identifiers. ? ?Location: ?Patient: Home  ?Provider: Office ?Persons participating in the virtual visit: patient/Nurse Health Advisor ?  ?I discussed the limitations, risks, security and privacy concerns of performing an evaluation and management service by telephone and the availability of in person appointments. The patient expressed understanding and agreed to proceed. ? ?Interactive audio and video telecommunications were attempted between this nurse and patient, however failed, due to patient having technical difficulties OR patient did not have access to video capability.  We continued and completed visit with audio only. ? ?Some vital signs may be absent or patient reported.  ? ?Criselda Peaches, LPN  ?Cardiac Risk Factors include: advanced age (>35mn, >>53women);hypertension;male gender ? ?   ?Objective:  ?  ?Today's Vitals  ? 06/13/21 1124  ?Weight: 175 lb (79.4 kg)  ?Height: '5\' 9"'$  (1.753 m)  ? ?Body mass index is 25.84 kg/m?. ? ?Advanced Directives 06/13/2021  ?Does Patient Have a Medical Advance Directive? No  ?Would patient like information on creating a medical advance directive? No - Patient declined  ? ? ?Current Medications (verified) ?Outpatient Encounter Medications as of 06/13/2021  ?Medication Sig  ? aspirin 81 MG EC tablet Take 81 mg by mouth every evening. Swallow whole.  ? atorvastatin (LIPITOR) 40 MG tablet Take 1 tablet (40 mg total) by mouth daily.  ? Azelastine HCl 137 MCG/SPRAY SOLN PLACE 1-2 SPRAYS INTO BOTH NOSTRILS 2 (TWO) TIMES DAILY.  ? cyclobenzaprine (FLEXERIL) 10 MG tablet Take 10 mg by mouth 3 (three) times daily as needed for muscle spasms.  ? docusate  sodium (COLACE) 100 MG capsule Take 200 mg by mouth every evening.  ? olmesartan (BENICAR) 20 MG tablet Take 1 tablet (20 mg total) by mouth daily.  ? ?No facility-administered encounter medications on file as of 06/13/2021.  ? ? ?Allergies (verified) ?Patient has no known allergies.  ? ?History: ?Past Medical History:  ?Diagnosis Date  ? Hyperlipidemia   ? Hypertension   ? ?Past Surgical History:  ?Procedure Laterality Date  ? BACK SURGERY    ? 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 INTRA-OP TEE, AORTIC VALVE REPLACEMENT, AORTIC ROOT REPLACEMENT, ASCENDING AORTA REPLACEMENT, STERNAL PLATING, PLACEMENT OF THE PREVENA INCISION WOUND MANAGEMENT SYSTEM  ? SHOULDER SURGERY    ? ?Family History  ?Problem Relation Age of Onset  ? Hyperlipidemia Mother   ? Hypertension Mother   ? ?Social History  ? ?Socioeconomic History  ? Marital status: Married  ?  Spouse name: Not on file  ? Number of children: Not on file  ? Years of education: 170 ? Highest education level: Some college, no degree  ?Occupational History  ? Occupation: Retired  ?Tobacco Use  ? Smoking status: Never  ? Smokeless tobacco: Never  ?Vaping Use  ? Vaping Use: Not on file  ?Substance and Sexual Activity  ? Alcohol use: Yes  ?  Comment: socially  ? Drug use: No  ? Sexual activity: Yes  ?Other Topics Concern  ? Not on file  ?Social History Narrative  ? Not on file  ? ?Social Determinants of Health  ? ?Financial Resource Strain: Low Risk   ?  Difficulty of Paying Living Expenses: Not hard at all  ?Food Insecurity: No Food Insecurity  ? Worried About Charity fundraiser in the Last Year: Never true  ? Ran Out of Food in the Last Year: Never true  ?Transportation Needs: No Transportation Needs  ? Lack of Transportation (Medical): No  ? Lack of Transportation (Non-Medical): No  ?Physical Activity: Sufficiently Active  ? Days of Exercise per Week: 5 days  ? Minutes of Exercise per Session: 60 min  ?Stress: No Stress Concern Present  ?  Feeling of Stress : Not at all  ?Social Connections: Moderately Isolated  ? Frequency of Communication with Friends and Family: More than three times a week  ? Frequency of Social Gatherings with Friends and Family: Once a week  ? Attends Religious Services: Never  ? Active Member of Clubs or Organizations: No  ? Attends Archivist Meetings: Never  ? Marital Status: Married  ? ? ? ?Clinical Intake: ? ?Pre-visit preparation completed: Yes ?How often do you need to have someone help you when you read instructions, pamphlets, or other written materials from your doctor or pharmacy?: 1 - Never ? ?Diabetic? No ? ?Information entered by :: Rolene Arbour LPN ?Activities of Daily Living ?In your present state of health, do you have any difficulty performing the following activities: 06/13/2021 06/12/2021  ?Hearing? N N  ?Vision? N N  ?Difficulty concentrating or making decisions? N N  ?Walking or climbing stairs? N N  ?Dressing or bathing? N N  ?Doing errands, shopping? N N  ?Preparing Food and eating ? N N  ?Using the Toilet? N N  ?In the past six months, have you accidently leaked urine? N N  ?Do you have problems with loss of bowel control? N N  ?Managing your Medications? N N  ?Managing your Finances? N N  ?Housekeeping or managing your Housekeeping? N N  ?Some recent data might be hidden  ? ? ?Patient Care Team: ?[name], [name], MD as PCP - General (Family Medicine) ?[name], MD as PCP - Cardiology (Cardiology) ? ?Indicate any recent Medical Services you may have received from other than [facility] providers in the past year (date may be approximate). ? ?   ?Assessment:  ? This is a routine wellness examination for Daniel Robinson. ? ?Hearing/Vision screen ?Hearing Screening - Comments:: No difficulty hearing ?Vision Screening - Comments:: Wears glasses. Followed by Dr Joya San ? ?Dietary issues and exercise activities discussed: ?Exercise limited by: None identified ? ? Goals Addressed   ? ?  ?  ?  ?  ?  ? This  Visit's Progress  ?   Increase physical activity (pt-stated)     ?   Stay healthy ?  ? ?  ? ?Depression Screen ?PHQ 2/9 Scores 06/13/2021 06/08/2021 04/15/2021 02/21/2021 09/09/2019 09/09/2019 11/14/2017  ?PHQ - 2 Score 0 0 0 0 0 0 0  ?PHQ- 9 Score - - - - 0 - -  ?  ?Fall Risk ?Fall Risk  06/13/2021 06/12/2021 04/14/2021 02/21/2021 09/09/2019  ?Falls in the past year? 0 0 0 1 0  ?Number falls in past yr: 0 - 0 0 0  ?Injury with Fall? 0 - 0 1 0  ?Risk for fall due to : No Fall Risks - Other (Comment) - -  ?Risk for fall due to: Comment - - Dizziness @ times - -  ?Follow up - - Falls evaluation completed - -  ? ? ?FALL RISK PREVENTION PERTAINING TO THE HOME: ? ?  Any stairs in or around the home? Yes  ?If so, are there any without handrails? No  ?Home free of loose throw rugs in walkways, pet beds, electrical cords, etc? Yes  ?Adequate lighting in your home to reduce risk of falls? Yes  ? ?ASSISTIVE DEVICES UTILIZED TO PREVENT FALLS: ? ?Life alert? No  ?Use of a cane, walker or w/c? No  ?Grab bars in the bathroom? Yes  ?Shower chair or bench in shower? Yes  ?Elevated toilet seat or a handicapped toilet? Yes  ? ?TIMED UP AND GO: ? ?Was the test performed? No . Audio Visit ? ?Cognitive Function: ?  ?6CIT Screen 06/13/2021  ?What Year? 0 points  ?What month? 0 points  ?What time? 0 points  ?Count back from 20 0 points  ?Months in reverse 0 points  ?Repeat phrase 0 points  ?Total Score 0  ? ? ?Immunizations ?Immunization History  ?Administered Date(s) Administered  ? Influenza,inj,Quad PF,6+ Mos 12/20/2012, 01/13/2015  ? Pneumococcal Polysaccharide-23 09/09/2019  ? Tdap 12/20/2012  ? ? ?TDAP status: Up to date ? ?Flu Vaccine status: Declined, Education has been provided regarding the importance of this vaccine but patient still declined. Advised may receive this vaccine at local pharmacy or Health Dept. Aware to provide a copy of the vaccination record if obtained from local pharmacy or Health Dept. Verbalized acceptance and  understanding. ? ?Pneumococcal vaccine status: Declined,  Education has been provided regarding the importance of this vaccine but patient still declined. Advised may receive this vaccine at local pharmacy or Health Dept

## 2021-06-14 ENCOUNTER — Encounter: Payer: Self-pay | Admitting: Thoracic Surgery (Cardiothoracic Vascular Surgery)

## 2021-06-14 ENCOUNTER — Ambulatory Visit (INDEPENDENT_AMBULATORY_CARE_PROVIDER_SITE_OTHER): Payer: Medicare Other | Admitting: Thoracic Surgery (Cardiothoracic Vascular Surgery)

## 2021-06-14 ENCOUNTER — Ambulatory Visit
Admission: RE | Admit: 2021-06-14 | Discharge: 2021-06-14 | Disposition: A | Discharge status: Home / Self Care | Payer: Medicare Other | Source: Ambulatory Visit | Attending: Thoracic Surgery (Cardiothoracic Vascular Surgery) | Admitting: Thoracic Surgery (Cardiothoracic Vascular Surgery)

## 2021-06-14 VITALS — BP 167/92 | HR 75 | Resp 20 | Ht 69.0 in | Wt 177.0 lb

## 2021-06-14 DIAGNOSIS — I7103 Dissection of thoracoabdominal aorta: Secondary | ICD-10-CM | POA: Diagnosis not present

## 2021-06-14 DIAGNOSIS — Z9889 Other specified postprocedural states: Secondary | ICD-10-CM | POA: Diagnosis not present

## 2021-06-14 MED ORDER — IOPAMIDOL (ISOVUE-370) INJECTION 76%
80.0000 mL | Freq: Once | INTRAVENOUS | Status: AC | PRN
  Administered 2021-06-14: 80 mL via INTRAVENOUS

## 2021-06-14 NOTE — Progress Notes (Signed)
? ?   ?[address] ?      [place] [address] ?            [phone]   ? ?  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 ? ?Daniel Robinson is a 71 year old man with a history of hypertension and hyperlipidemia, who had a type I dissection last fall.  He was out in [place] hunting.  He developed severe chest pain.  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  He underwent repair of the dissection with a hemiarch repair using a connect Resilia bovine pericardial aortic valve conduit.  He was referred to [place] for follow-up. ? ?He is feeling well.  Has been doing cardiac rehab.  He has been monitoring his blood pressure at home.  It tends to be elevated in the evenings.  He is currently only on olmesartan.  He had been on Coreg at one point and that was discontinued and he was started on Toprol-XL.  That also has been discontinued. ? ?Past Medical History:  ?Diagnosis Date  ? Hyperlipidemia   ? Hypertension   ? ? ?Current Outpatient Medications  ?Medication Sig Dispense Refill  ? aspirin 81 MG EC tablet Take 81 mg by mouth every evening. Swallow whole.    ? atorvastatin (LIPITOR) 40 MG tablet Take 1 tablet (40 mg total) by mouth daily. 90 tablet 3  ? Azelastine HCl 137 MCG/SPRAY SOLN PLACE 1-2 SPRAYS INTO BOTH NOSTRILS 2 (TWO) TIMES DAILY. 30 mL 2  ? docusate sodium (COLACE) 100 MG capsule Take 200 mg by mouth every evening.    ? olmesartan (BENICAR) 20 MG tablet Take 1 tablet (20 mg total) by mouth daily. 90 tablet 1  ? ?No current facility-administered medications for this visit.  ? ? ?Physical Exam ?BP (!) 167/92 (BP Location: Right Arm, Patient Position: Sitting)   Pulse 75   Resp 20   Ht '5\' 9"'$  (1.753 m)   Wt 177 lb (80.3 kg)   SpO2 96% Comment: RA  BMI 26.14 kg/m?  ?Well-developed well-nourished 71 year old man ?Alert and oriented x3 with no focal deficits ?Cardiac regular rate and rhythm with a faint systolic murmur ?Carotids without bruits ?Lungs  clear ?No peripheral edema ?Pulses intact ? ?Diagnostic Tests: ?CT ANGIOGRAPHY CHEST WITH CONTRAST ?  ?TECHNIQUE: ?Multidetector CT imaging of the chest was performed using the ?standard protocol during bolus administration of intravenous ?contrast. Multiplanar CT image reconstructions and MIPs were ?obtained to evaluate the vascular anatomy. ?  ?RADIATION DOSE REDUCTION: This exam was performed according to the ?departmental dose-optimization program which includes automated ?exposure control, adjustment of the mA and/or kV according to ?patient size and/or use of iterative reconstruction technique. ?  ?CONTRAST:  64m ISOVUE-370 IOPAMIDOL (ISOVUE-370) INJECTION 76% ?  ?COMPARISON:  None. ?  ?FINDINGS: ?Cardiovascular: Status post surgical repair of ascending thoracic ?aortic aneurysm or dissection. Status post aortic valve repair. ?There appears to be a type A dissection that begins around the ?origin of the right innominate artery and extends through the ?transverse aortic arch and descending thoracic aorta and into the ?visualized portion of abdominal aorta. Dissection is seen to extend ?into the right innominate artery and into the visualized portion of ?the proximal right common carotid artery. Left common carotid artery ?is widely patent. Dissection flap is seen extending into the left ?subclavian artery. Normal cardiac size. No pericardial effusion. ?  ?Mediastinum/Nodes: No enlarged mediastinal, hilar, or axillary lymph ?nodes. Thyroid gland,  trachea, and esophagus demonstrate no ?significant findings. ?  ?Lungs/Pleura: Lungs are clear. No pleural effusion or pneumothorax. ?  ?Upper Abdomen: Thoracic aortic dissection is seen extending into the ?visualized proximal abdominal aorta. Celiac artery appears to arise ?from both the true and false lumen. Superior mesenteric artery ?arises from true lumen. Left renal artery arises from true lumen. ?Dominant inferior right renal artery arises from true lumen.  Smaller ?superior right renal artery arises from false lumen. Inferior ?mesenteric artery arises from false lumen. ?  ?Musculoskeletal: No chest wall abnormality. No acute or significant ?osseous findings. ?  ?Review of the MIP images confirms the above findings. ?  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 Status post aortic valve repair. ?  ?Type A thoracic aortic dissection is noted that begins around the ?origin of the right innominate artery and extends to the transverse ?aortic arch and through the descending thoracic aorta and into the ?visualized portion of proximal abdominal aorta. ?  ?Dissection flap is seen to extend into the right innominate artery ?and into the visualized portion of proximal right common carotid ?artery. Dissection flap is also seen extending into the left ?subclavian artery. ?  ?  ?Electronically Signed ?  By: Marijo Conception M.D. ?  On: 06/14/2021 11:43 ?I personally reviewed his CT images.  There is a type I aortic dissection status post replacement of the aortic valve and ascending aorta.  Small area of flow into the thrombosed false lumen at origin of left subclavian.  Flow in both the true and false lumens below the diaphragm. ? ?Impression: ?Daniel Robinson is a 71 year old man with a past medical history significant for hypertension, hyperlipidemia, and type I aortic dissection status post repair. ? ?Overall is doing extremely well.  He is now about 5 months out from repair. ? ?I am concerned about his blood pressure.  He says it typically runs relatively low during the day but higher in the evenings.  He currently is only taking olmesartan.  He had been on Coreg and Toprol previously but both of those were stopped.  I do think a beta-blocker would be a better option for him if he can find 1 he can tolerate.  He knows the importance of blood pressure control.  Will defer to Dr. [name] and Dr. Ali Lowe for blood pressure  management. ? ?Plan: ?Return in 6 months with CT angiogram of chest ?Monitor blood pressure ?Added quinolone antibiotics to contraindication with ? ?Melrose Nakayama, MD ?[facility] ?[phone] ? ? ? ? ?

## 2021-06-15 ENCOUNTER — Encounter (HOSPITAL_COMMUNITY)
Admission: RE | Admit: 2021-06-15 | Discharge: 2021-06-15 | Disposition: A | Discharge status: Home / Self Care | Payer: Medicare Other | Source: Ambulatory Visit | Attending: Internal Medicine | Admitting: Internal Medicine

## 2021-06-15 ENCOUNTER — Other Ambulatory Visit: Payer: Self-pay

## 2021-06-15 DIAGNOSIS — Z48812 Encounter for surgical aftercare following surgery on the circulatory system: Secondary | ICD-10-CM | POA: Diagnosis not present

## 2021-06-15 DIAGNOSIS — Z952 Presence of prosthetic heart valve: Secondary | ICD-10-CM

## 2021-06-16 ENCOUNTER — Telehealth: Payer: Self-pay | Admitting: *Deleted

## 2021-06-16 DIAGNOSIS — I10 Essential (primary) hypertension: Secondary | ICD-10-CM

## 2021-06-16 NOTE — Telephone Encounter (Signed)
Will route to Dr. Ali Lowe to review the patient message to Dr. Roxan Hockey on 06/14/21 before asking him to restart Toprol XL. ?

## 2021-06-16 NOTE — Telephone Encounter (Signed)
-----   Message from [name], MD sent at 06/14/2021  1:39 PM EDT ----- ?Belvia Gotschall, ?    Can we restart Mr M's Toprol.  We had to stop it because he had some low BP readings.  Now he is hypertensive.  Let's start Toprol XL '25mg'$  at bedtime and have him come in next week for a nurse visit re BP control. ? ?Thx, ?AT ? ? ?----- Message ----- ?From: [name], MD ?Sent: 06/14/2021   1:36 PM EDT ?To: Melrose Nakayama, MD, [name] [name], MD, # ? ?Thanks Richardson Landry.    ? ?I had started him on Toprol XL when I saw him and am not sure why he is not on it.  Will get him back on it.  ? ?-AT ?----- Message ----- ?From: Melrose Nakayama, MD ?Sent: 06/14/2021   1:01 PM EDT ?To: [name] [name], MD, [name], MD ? ?Inez Catalina and Clarene Duke ? ?I saw Macyn Remmert in the office today.  As you know he is a 71 year old man who had a type I dissection out in [place].  His blood pressure was elevated today with systolic of 161.  He is currently only on olmesartan.  He has been on a couple different beta-blockers in the past but they were discontinued.  It would be nice if we can get him on a beta-blocker as he does have aortic dissection extending into the abdomen.  Will defer to you guys on blood pressure management.  I will see him back in 6 months with a CT angiogram. ? ?Thanks ? ?Richardson Landry ? ? ?

## 2021-06-17 ENCOUNTER — Other Ambulatory Visit: Payer: Self-pay

## 2021-06-17 ENCOUNTER — Ambulatory Visit (INDEPENDENT_AMBULATORY_CARE_PROVIDER_SITE_OTHER): Payer: Medicare Other | Admitting: *Deleted

## 2021-06-17 ENCOUNTER — Encounter: Payer: Self-pay | Admitting: *Deleted

## 2021-06-17 ENCOUNTER — Encounter (HOSPITAL_COMMUNITY)
Admission: RE | Admit: 2021-06-17 | Discharge: 2021-06-17 | Disposition: A | Discharge status: Home / Self Care | Payer: Medicare Other | Source: Ambulatory Visit | Attending: Internal Medicine | Admitting: Internal Medicine

## 2021-06-17 VITALS — BP 162/90 | HR 84 | Ht 69.0 in | Wt 179.1 lb

## 2021-06-17 DIAGNOSIS — Z48812 Encounter for surgical aftercare following surgery on the circulatory system: Secondary | ICD-10-CM | POA: Diagnosis not present

## 2021-06-17 DIAGNOSIS — I10 Essential (primary) hypertension: Secondary | ICD-10-CM

## 2021-06-17 DIAGNOSIS — Z952 Presence of prosthetic heart valve: Secondary | ICD-10-CM

## 2021-06-17 NOTE — Progress Notes (Signed)
? ?  Nurse Visit  ?  ?Date of Encounter: 06/17/2021 ?ID: Daniel Robinson, DOB [birth year], MRN [id number] ? ?PCP:  [name], [name], MD ?  ?Cardiologist:  [name], MD  ?Advanced Practice Provider:  No care team member to display ?Electrophysiologist:  None  ?   ? ? ?Visit Details  ? ?VS:  BP (!) 162/90   Pulse 84   Ht '5\' 9"'$  (1.753 m)   Wt 179 lb 2 oz (81.3 kg)   BMI 26.45 kg/m?  , BMI Body mass index is 26.45 kg/m?. ? ?Wt Readings from Last 3 Encounters:  ?06/17/21 179 lb 2 oz (81.3 kg)  ?06/14/21 177 lb (80.3 kg)  ?06/13/21 175 lb (79.4 kg)  ?  ? ?Reason for visit: BP check ?Performed today: Vitals, EKG, and Provider consulted: Dr Lovena Le ?Changes (medications, testing, etc.) : will forward to Dr. Ali Lowe for recommendations ?Length of Visit: 30 minutes ? ?Medications Adjustments/Labs and Tests Ordered: ?No orders of the defined types were placed in this encounter. ? ?No orders of the defined types were placed in this encounter. ? ?Summary:  Patient came in today at the request of Dr.Thukkani to calibrate his home BP cuff.  His cuff showed 166/95(R) our manual cuff showed 162/90(L).  Patient says today at cardiac rehab his BP before exercise was 120/70 during he said his systolic was 433, and after exercise 98/60.  He says that when he is home he can relax and his pressure will come down.  HR's are in the 80's.  I rechecked with both cuffs after waiting 15 min and his cuff read 178/120(R), our cuff 158/90 (L).  He also mentioned that it is always higher in his right arm.  This is the arm they took his pressure on during his CT.  When he checks it at home he checks in his left arm.  I moved his cuff to his left arm after 5 min and got a reading of 151/85 with our cuff reading 148/93.  Please recommend regarding Toprol.   ?Signed, ?Janan Halter, RN  ?06/17/2021 6:28 PM ? ? ?  ?

## 2021-06-17 NOTE — Telephone Encounter (Signed)
Daniel Osmond, MD ? ?Please have him come in for nurse visit to check BP.  Have him bring in his BP monitor to check against ours.  Then we will make a decision about Toprol.  Thx  ? ?Patient will come in today (06/17/21) for a BP check. Set nurse visit for 11 am.  ?Patient verbalized understanding and agreement.  ?

## 2021-06-20 MED ORDER — METOPROLOL SUCCINATE ER 25 MG PO TB24: 25.0000 mg | ORAL_TABLET | Freq: Every day | ORAL | 3 refills | Status: DC

## 2021-06-20 NOTE — Addendum Note (Signed)
Addended by: Rodman Key on: 06/20/2021 05:16 PM ? ? Modules accepted: Orders ? ?

## 2021-06-20 NOTE — Telephone Encounter (Signed)
Saw the nurse visit note, thank you.  Let's restart Toprol XL '25mg'$  and have him see pharmacy for HTN management.  Thanks. ? ? ?Reviewed recommendation from Dr. Ali Lowe with the patient.  He has Toprol XL already so I will not send new prescription at this time.  He will take it after breakfast daily.    He takes the olmesartan around 6 pm daily and said the last time he was on both he took at the same time and that made him feel "dumped".    I asked him to monitor BPs and have scheduled him with HTN clinic next available on 07/13/21.  He is going on vacation April 1-14 and will take his cuff w him.   Referral placed for PharmD. ?

## 2021-06-20 NOTE — Progress Notes (Signed)
Discharge Progress Report ? ?Patient Details  ?Name: Daniel Robinson ?MRN: [id number] ?Date of Birth: 1950/12/23 ?Referring Provider:   ?Flowsheet Row CARDIAC REHAB PHASE II ORIENTATION from 04/14/2021 in [facility]  ?Referring Provider [name], MD  ? ?  ? ? ? ?Number of Visits: 27 ? ?Reason for Discharge:  ?Patient reached a stable level of exercise. ?Patient independent in their exercise. ?Patient has met program and personal goals. ? ?Smoking History:  ?Social History  ? ?Tobacco Use  ?Smoking Status Never  ?Smokeless Tobacco Never  ? ? ?Diagnosis:  ?02/10/21 S/P Aortic Dissection, S/P Aortic Valve Replacement, [place] ? ?ADL [facility]SD: ? ? ?Initial Exercise Prescription: ? Initial Exercise Prescription - 04/14/21 1300   ? ?  ? Date of Initial Exercise RX and Referring Provider  ? Date 04/14/21   ? Referring Provider [name], MD   ? Expected Discharge Date 06/10/21   ?  ? NuStep  ? Level 2   ? SPM 80   ? Minutes 15   ? METs 2.5   ?  ? Arm Ergometer  ? Level 1.8   ? Minutes 15   ? METs 2.3   ?  ? Prescription Details  ? Frequency (times per week) 3   ? Duration Progress to 30 minutes of continuous aerobic without signs/symptoms of physical distress   ?  ? Intensity  ? THRR 40-80% of Max Heartrate 61-122   ? Ratings of Perceived Exertion 11-13   ? Perceived Dyspnea 0-4   ?  ? Progression  ? Progression Continue progressive overload as per policy without signs/symptoms or physical distress.   ?  ? Resistance Training  ? Training Prescription Yes   ? Weight 3   ? Reps 10-15   ? ?  ?  ? ?  ? ? ?Discharge Exercise Prescription (Final Exercise Prescription Changes): ? Exercise Prescription Changes - 06/17/21 0830   ? ?  ? Response to Exercise  ? Blood Pressure (Admit) 128/66   ? Blood Pressure (Exercise) 172/78   ? Blood Pressure (Exit) 94/60   ? Heart Rate (Admit) 98 bpm   ? Heart Rate (Exercise) 132 bpm   ? Heart Rate (Exit) 100 bpm   ? Rating of Perceived Exertion  (Exercise) 13.5   ? Symptoms None   ? Comments Pt graduated the CRP2 program   ? Duration Continue with 30 min of aerobic exercise without signs/symptoms of physical distress.   ? Intensity THRR unchanged   ?  ? Progression  ? Progression Continue to progress workloads to maintain intensity without signs/symptoms of physical distress.   ? Average METs 4.9   ?  ? Resistance Training  ? Training Prescription Yes   ? Weight 4   ? Reps 10-15   ? Time 10 Minutes   ?  ? NuStep  ? Level 4   ? SPM 100   ? Minutes 15   ? METs 6.2   ?  ? Arm Ergometer  ? Level 3   ? Minutes 15   ? METs 3.6   ?  ? Home Exercise Plan  ? Plans to continue exercise at Home (comment)   ? Frequency Add 3 additional days to program exercise sessions.   ? Initial Home Exercises Provided 05/02/21   ? ?  ?  ? ?  ? ? ?Functional Capacity: ? 6 Minute Walk   ? ? [place] Name 04/14/21 0847 06/08/21 0830  ?  ?  ?  6 Minute Walk  ? Phase Initial Discharge   ? Distance 1564 feet 1724 feet   ? Distance % Change -- 10.23 %   ? Distance Feet Change -- 160 ft   ? Walk Time 6 minutes 6 minutes   ? # of Rest Breaks 0 0   ? MPH 2.96 3.27   ? METS 3.45 4.01   ? RPE 10 9   ? Perceived Dyspnea  0 0   ? VO2 Peak 12.07 14.05   ? Symptoms No No   ? Resting HR 93 bpm 86 bpm   ? Resting BP 104/70 120/62   ? Resting Oxygen Saturation  97 % 86 %   ? Exercise Oxygen Saturation  during 6 min walk 100 % 98 %   ? Max Ex. HR 102 bpm 107 bpm   ? Max Ex. BP 100/60 140/62   ? 2 Minute Post BP 92/60 132/64   ? ?  ?  ? ?  ? ? ?Psychological, QOL, Others - Outcomes: ?PHQ 2/9: ? ?  06/13/2021  ? 11:29 AM 06/08/2021  ?  7:50 AM 04/15/2021  ?  9:30 AM 02/21/2021  ? 10:40 AM 09/09/2019  ?  8:24 AM  ?Depression screen PHQ 2/9  ?Decreased Interest 0 0 0 0 0  ?Down, Depressed, Hopeless 0 0 0 0 0  ?PHQ - 2 Score 0 0 0 0 0  ?Altered sleeping     0  ?Tired, decreased energy     0  ?Change in appetite     0  ?Feeling bad or failure about yourself      0  ?Trouble concentrating     0  ?Moving slowly or  fidgety/restless     0  ?Suicidal thoughts     0  ?PHQ-9 Score     0  ?Difficult doing work/chores     Not difficult at all  ? ? ?Quality of Life: ? Quality of Life - 06/10/21 0953   ? ?  ? Quality of Life  ? Select Quality of Life   ?  ? Quality of Life Scores  ? Health/Function Post 27.27 %   ? Socioeconomic Post 21.57 %   ? Psych/Spiritual Post 28.43 %   ? Family Post 29.5 %   ? GLOBAL Post 26.66 %   ? ?  ?  ? ?  ? ? ?Personal Goals: ?Goals established at orientation with interventions provided to work toward goal. ? Personal Goals and Risk Factors at Admission - 04/14/21 1345   ? ?  ? Core Components/Risk Factors/Patient Goals on Admission  ?  Weight Management Weight Maintenance   ? Hypertension Yes   ? Intervention Provide education on lifestyle modifcations including regular physical activity/exercise, weight management, moderate sodium restriction and increased consumption of fresh fruit, vegetables, and low fat dairy, alcohol moderation, and smoking cessation.;Monitor prescription use compliance.   ? Expected Outcomes Long Term: Maintenance of blood pressure at goal levels.;Short Term: Continued assessment and intervention until BP is < 140/54m HG in hypertensive participants. < 130/867mHG in hypertensive participants with diabetes, heart failure or chronic kidney disease.   ? Lipids Yes   ? Intervention Provide education and support for participant on nutrition & aerobic/resistive exercise along with prescribed medications to achieve LDL <705mHDL >65m77m ? Expected Outcomes Short Term: Participant states understanding of desired cholesterol values and is compliant with medications prescribed. Participant is following exercise prescription and nutrition guidelines.;Long Term: Cholesterol controlled with  medications as prescribed, with individualized exercise RX and with personalized nutrition plan. Value goals: LDL < 72m, HDL > 40 mg.   ? ?  ?  ? ?  ?  ? ?Personal Goals Discharge: ? Goals and Risk  Factor Review   ? ? [place] 05[place]21 1747 06/07/21 1630  ?  ?  ?  ?  ? Core Components/Risk Factors/Patient Goals Review  ? Personal Goals Review Weight Management/Obesity;Hypertension;Lipids Weight Management/Obesity;Hypertension;Lipids     ? Review KAudreyhas been doing well with exercise. Some resting exertional BP's noted in the 90's will continue to monitor. KSymeonhas been doing well with exercise. KLavariswill complete phase 2 cardiac rehab on 06/10/21     ? Expected Outcomes KDecariwill continue to participate in phase 2 cardiac rehab for exercise, nutrition and lifestyle modifications KJahmalwill continue to  exercise, follow  nutrition and lifestyle modifications upon completion of phase 2 cardiac rehab     ? ?  ?  ? ?  ? ? ?Exercise Goals and Review: ? Exercise Goals   ? ? RVelvaName 04/14/21 1350  ?  ?  ?  ?  ?  ? Exercise Goals  ? Increase Physical Activity Yes      ? Intervention Provide advice, education, support and counseling about physical activity/exercise needs.;Develop an individualized exercise prescription for aerobic and resistive training based on initial evaluation findings, risk stratification, comorbidities and participant's personal goals.      ? Expected Outcomes Short Term: Attend rehab on a regular basis to increase amount of physical activity.;Long Term: Add in home exercise to make exercise part of routine and to increase amount of physical activity.;Long Term: Exercising regularly at least 3-5 days a week.      ? Increase Strength and Stamina Yes      ? Intervention Provide advice, education, support and counseling about physical activity/exercise needs.;Develop an individualized exercise prescription for aerobic and resistive training based on initial evaluation findings, risk stratification, comorbidities and participant's personal goals.      ? Expected Outcomes Short Term: Increase workloads from initial exercise prescription for resistance, speed, and METs.;Short Term: Perform  resistance training exercises routinely during rehab and add in resistance training at home;Long Term: Improve cardiorespiratory fitness, muscular endurance and strength as measured by increased METs and functional c

## 2021-06-20 NOTE — Progress Notes (Signed)
? ?HPI: ?Daniel Robinson is a 71 y.o. male  is here today for follow up. ? ?Last seen on 04/01/21. 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 ?Since his last visit he completed cardiac rehab and has been following with cardiologist. ?Fatigue has improved, he has been more active with chores around the house. ?Had chest CTA on 06/14/21: Type A thoracic aortic dissection is noted that begins around the origin of the right innominate artery and extends to the transverse aortic arch and through the descending thoracic aorta and into the visualized portion of proximal abdominal aorta. ?  ?Dissection flap is seen to extend into the right innominate artery ?and into the visualized portion of proximal right common carotid ?artery. Dissection flap is also seen extending into the left ?subclavian artery. ?Hypertension:  ?Medications:Benicar 20 mg daily, recently Metoprolol succinate 25 mg was added because BP and HR was elevated at his cardiologist's office.He has taken medication twice and has been well tolerated. ?BP readings at home:100's/60's ?Side effects:None ? ?Negative for unusual or severe headache, visual changes, exertional chest pain, dyspnea,  focal weakness, or edema. ?Occasionally he can feel his HR, normal rhythm and rate.Usually when he is sitting or in bed at night. ? ?Lab Results  ?Component Value Date  ? CREATININE 0.79 03/02/2021  ? BUN 13 03/02/2021  ? NA 137 03/02/2021  ? K 4.5 03/02/2021  ? K 4.5 03/02/2021  ? CL 105 03/02/2021  ? CO2 28 03/02/2021  ? ?Since hospital discharge, 01/2021,he has had abnormal urine stream, it "goes in all directions", "high spead." ?Negative for dysuria,gross hematuria,or urine incontinence. ?He has noted same problem with ejaculation. ? ?Review of Systems  ?Constitutional:  Negative for appetite change, chills and fever.  ?HENT:  Negative for mouth sores, nosebleeds, sore throat and trouble  swallowing.   ?Respiratory:  Negative for cough and wheezing.   ?Gastrointestinal:  Negative for abdominal pain, nausea and vomiting.  ?Musculoskeletal:  Negative for gait problem and myalgias.  ?Skin:  Negative for rash.  ?Neurological:  Negative for syncope and facial asymmetry.  ?Rest of ROS see pertinent positives and negatives in HPI. ? ?Current Outpatient Medications on File Prior to Visit  ?Medication Sig Dispense Refill  ? aspirin 81 MG EC tablet Take 81 mg by mouth every evening. Swallow whole.    ? atorvastatin (LIPITOR) 40 MG tablet Take 1 tablet (40 mg total) by mouth daily. 90 tablet 3  ? Azelastine HCl 137 MCG/SPRAY SOLN PLACE 1-2 SPRAYS INTO BOTH NOSTRILS 2 (TWO) TIMES DAILY. 30 mL 2  ? docusate sodium (COLACE) 100 MG capsule Take 200 mg by mouth every evening.    ? metoprolol succinate (TOPROL XL) 25 MG 24 hr tablet Take 1 tablet (25 mg total) by mouth daily. 90 tablet 3  ? olmesartan (BENICAR) 20 MG tablet Take 1 tablet (20 mg total) by mouth daily. 90 tablet 1  ? ?No current facility-administered medications on file prior to visit.  ? ? ?Past Medical History:  ?Diagnosis Date  ? Aortic dissection, thoracoabdominal ([place]) 01/2021  ? Hyperlipidemia   ? Hypertension   ? ?Allergies  ?Allergen Reactions  ? Quinolones Other (See Comments)  ?  Aortic dissection/ aneurysm  ? ? ?Social History  ? ?Socioeconomic History  ? Marital status: Married  ?  Spouse name: Not on file  ? Number of children: Not on file  ? Years of education: 65  ? Highest education level: Some  college, no degree  ?Occupational History  ? Occupation: Retired  ?Tobacco Use  ? Smoking status: Never  ? Smokeless tobacco: Never  ?Vaping Use  ? Vaping Use: Not on file  ?Substance and Sexual Activity  ? Alcohol use: Yes  ?  Comment: socially  ? Drug use: No  ? Sexual activity: Yes  ?Other Topics Concern  ? Not on file  ?Social History Narrative  ? Not on file  ? ?Social Determinants of Health  ? ?Financial Resource Strain: Low Risk   ?  Difficulty of Paying Living Expenses: Not hard at all  ?Food Insecurity: No Food Insecurity  ? Worried About Charity fundraiser in the Last Year: Never true  ? Ran Out of Food in the Last Year: Never true  ?Transportation Needs: No Transportation Needs  ? Lack of Transportation (Medical): No  ? Lack of Transportation (Non-Medical): No  ?Physical Activity: Sufficiently Active  ? Days of Exercise per Week: 5 days  ? Minutes of Exercise per Session: 60 min  ?Stress: No Stress Concern Present  ? Feeling of Stress : Not at all  ?Social Connections: Moderately Isolated  ? Frequency of Communication with Friends and Family: More than three times a week  ? Frequency of Social Gatherings with Friends and Family: Once a week  ? Attends Religious Services: Never  ? Active Member of Clubs or Organizations: No  ? Attends Archivist Meetings: Never  ? Marital Status: Married  ? ?Vitals:  ? 06/21/21 1450  ?BP: 130/78  ?Pulse: 71  ?Resp: 12  ?SpO2: 97%  ? ?Body mass index is 26.91 kg/m?. ? ?Physical Exam ?Nursing note reviewed.  ?Constitutional:   ?   General: He is not in acute distress. ?   Appearance: He is well-developed.  ?HENT:  ?   Head: Normocephalic and atraumatic.  ?Eyes:  ?   Conjunctiva/sclera: Conjunctivae normal.  ?Cardiovascular:  ?   Rate and Rhythm: Normal rate and regular rhythm. Occasional Extrasystoles are present. ?   Heart sounds: Murmur (Soft SEM LUSB and RUSB) heard.  ?Pulmonary:  ?   Effort: Pulmonary effort is normal. No respiratory distress.  ?   Breath sounds: Normal breath sounds.  ?Abdominal:  ?   Palpations: Abdomen is soft. There is no hepatomegaly or mass.  ?   Tenderness: There is no abdominal tenderness.  ?Lymphadenopathy:  ?   Cervical: No cervical adenopathy.  ?Skin: ?   General: Skin is warm.  ?   Findings: No erythema or rash.  ?Neurological:  ?   Mental Status: He is alert and oriented to person, place, and time.  ?   Cranial Nerves: No cranial nerve deficit.  ?   Gait: Gait  normal.  ?Psychiatric:  ?   Comments: Well groomed, good eye contact.  ? ?ASSESSMENT AND PLAN: ? ?Mr.Bartley was seen today for follow-up. ? ?Diagnoses and all orders for this visit: ? ?Primary hypertension ?BP adequately controlled and reported home BP's are lower. ?Continue Benicar 20 mg daily and Metoprolol succinate 25 mg daily. ?Continue monitoring BP regularly. ? ?Today mildly irregular HR, recommend monitoring HR for a minute at the time a few times per week. ?Clearly instructed about warning signs. ?He has appt with cardiologist 07/13/21. ? ?Urine stream spraying ?Could have been caused by urethral trauma during hospitalization. ?It has been persistent sine 01/2021. ?Urology referral placed. ? ?Dissection of thoracoabdominal aorta (HCC) ?Noted on recent chest CTA, planning on having it repeated in 6 months. ?We  discussed the important of adequate BP and HR control. ?He is following with cardiothoracic surgeon and cardiologist. ? ?Return in about 6 months (around 12/22/2021). ? ?Jolin Benavides G. [name], MD ? ?[facility]. ?[facility] office. ? ? ?

## 2021-06-21 ENCOUNTER — Encounter: Payer: Self-pay | Admitting: Family Medicine

## 2021-06-21 ENCOUNTER — Ambulatory Visit (INDEPENDENT_AMBULATORY_CARE_PROVIDER_SITE_OTHER): Payer: Medicare Other | Admitting: Family Medicine

## 2021-06-21 ENCOUNTER — Ambulatory Visit: Payer: Medicare Other | Admitting: Family Medicine

## 2021-06-21 VITALS — BP 130/78 | HR 71 | Resp 12 | Ht 69.0 in | Wt 182.2 lb

## 2021-06-21 DIAGNOSIS — E782 Mixed hyperlipidemia: Secondary | ICD-10-CM

## 2021-06-21 DIAGNOSIS — I10 Essential (primary) hypertension: Secondary | ICD-10-CM

## 2021-06-21 DIAGNOSIS — R39198 Other difficulties with micturition: Secondary | ICD-10-CM

## 2021-06-21 DIAGNOSIS — I7103 Dissection of thoracoabdominal aorta: Secondary | ICD-10-CM | POA: Diagnosis not present

## 2021-06-21 NOTE — Patient Instructions (Addendum)
A few things to remember from today's visit: ? ?Primary hypertension ? ?Urine stream spraying - Plan: Ambulatory referral to Urology ? ?If you need refills please call your pharmacy. ?Do not use My Chart to request refills or for acute issues that need immediate attention. ?  ?Monitor heart rate regularly during one minute. ?No changes today. ? ?Please be sure medication list is accurate. ?If a new problem present, please set up appointment sooner than planned today. ? ? ? ? ? ? ? ?

## 2021-06-22 MED ORDER — AZELASTINE HCL 137 MCG/SPRAY NA SOLN: NASAL | 2 refills | Status: DC

## 2021-06-22 NOTE — Addendum Note (Signed)
Addended by: Rodrigo Ran on: 06/22/2021 08:49 AM ? ? Modules accepted: Orders ? ?

## 2021-07-12 NOTE — Progress Notes (Signed)
Patient ID: Daniel Robinson                 DOB: 1950/06/17                      MRN: [id number] ? ? ? ? ?HPI: ?Daniel Robinson is a 72 y.o. male referred by Dr. Ali Lowe to HTN clinic. 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 Pt saw Dr. Roxan Hockey on 06/14/21 and his BP was elevated 167/92 at visit. Pt reported it typically runs low during the day and higher in the evenings (was only on olmesartan 20 mg at this time). Said he tried carvedilol and metoprolol in the past, but both were stopped d/t low BP. Dr. Roxan Hockey deferred to Dr. Ali Lowe who started pt on metoprolol 02/2021 but stopped 04/14/21 when pt reported low BPs (92/60). Home BP cuff validated with clinic cuff on 06/17/21 - BP was elevated on both: 166/95 (home cuff) and 162/90 (clinic cuff). Pt stated his home BP is low when he is relaxed and his pressure runs higher in his right arm as well as in clinic. Dr. Ali Lowe restarted metoprolol succinate 25 mg daily. Dr. Roxan Hockey also prefers pt to be on a BB d/t his aortic dissection. BP at subsequent family med appointment was 130/78.  ? ?Pt presents today to HTN clinic appointment. Reports med compliance, takes his metoprolol succinate in the morning and olmesartan at night. Did take his metoprolol prior to visit. Reports occasional dizziness/lightheadedness when he stands up. Denies blurred vision or headaches. He has noticed more palpitations when he is sitting or laying down and is unsure of the cause. They have not gotten better after starting metoprolol. Says his home readings in the morning are well controlled, 110s/70-80s, but BP at night is high (175/95, 140-150s). It does come down to the 110-120s after waiting a few minutes and reports most of his readings are at goal. Does check his night BP after taking his olmesartan. Reports white coat HTN.  ? ?Pt also reported muscle pain and fatigue two weeks after switching  from simvastatin to atorvastatin 40 mg daily. He tolerated simvastatin for years in the past with no problems.  ? ?Current HTN meds: olmesartan 20 mg daily, metoprolol succinate 25 mg daily ?Previously tried: lisinopril-HCTZ (stopped at d/c from hospital in 01/2021), lisinopril (cough), carvedilol (hypotension) ? ?BP goal: <130/80 ? ?Family history: Father - MI ? ?Social History: Denies tobacco and illicit drug use. Reports social alcohol use. ? ?Diet: 1 20 oz of coffee per day; 1-2 diet cokes. Limits salt in diet (does not add extra salt) ? ?Exercise: Cardiac rehab ? ?Wt Readings from Last 3 Encounters:  ?06/21/21 182 lb 4 oz (82.7 kg)  ?06/17/21 179 lb 2 oz (81.3 kg)  ?06/14/21 177 lb (80.3 kg)  ? ?BP Readings from Last 3 Encounters:  ?06/21/21 130/78  ?06/17/21 (!) 162/90  ?06/14/21 (!) 167/92  ? ?Pulse Readings from Last 3 Encounters:  ?06/21/21 71  ?06/17/21 84  ?06/14/21 75  ? ? ?Renal function: ?CrCl cannot be calculated (Patient's most recent lab result is older than the maximum 21 days allowed.). ? ?Past Medical History:  ?Diagnosis Date  ? Aortic dissection, thoracoabdominal ([place]) 01/2021  ? Hyperlipidemia   ? Hypertension   ? ? ?Current Outpatient Medications on File Prior to Visit  ?Medication Sig Dispense Refill  ? aspirin 81 MG EC tablet Take 81 mg  by mouth every evening. Swallow whole.    ? atorvastatin (LIPITOR) 40 MG tablet Take 1 tablet (40 mg total) by mouth daily. 90 tablet 3  ? Azelastine HCl 137 MCG/SPRAY SOLN PLACE 1-2 SPRAYS INTO BOTH NOSTRILS 2 (TWO) TIMES DAILY. 90 mL 2  ? docusate sodium (COLACE) 100 MG capsule Take 200 mg by mouth every evening.    ? metoprolol succinate (TOPROL XL) 25 MG 24 hr tablet Take 1 tablet (25 mg total) by mouth daily. 90 tablet 3  ? olmesartan (BENICAR) 20 MG tablet Take 1 tablet (20 mg total) by mouth daily. 90 tablet 1  ? ?No current facility-administered medications on file prior to visit.  ? ?Allergies  ?Allergen Reactions  ? Quinolones Other (See Comments)   ?  Aortic dissection/ aneurysm  ? ?Assessment/Plan: ? ?1. Hypertension - BP 142/80 above goal <130/80 mmHg. Pt does check BP at home with most readings being at goal and reports white coat HTN. Pt is experiencing palpitations as well. Will increase metoprolol succinate to 50 mg daily since no improvement of palpitations on 25 mg dose of metoprolol. Continue olmesartan 20 mg daily. Discussed limiting daily caffeine intake by switching to caffeine free diet Cokes which should also help with palpitations. Pt advised to continue monitoring BP and palpitations at home and to call clinic with any consistently elevated BP readings. ? ?2. Hyperlipidemia - Pt reported developing muscle pain and fatigue two weeks after switching from simvastatin to atorvastatin 40 mg daily. Will stop atorvastatin and in 1 week, start rosuvastatin 20 mg once daily. Discussed that pt should feel some improvement in muscle aches in this week washout. Will recheck labs at visit with Dr. Ali Lowe on 09/19/21.  ? ?Follow-up with PharmD as needed.  ? ?Patient seen with Debria Garret, [employer] pharmacy student ? ?Megan E. Supple, PharmD, BCACP, CPP ?[id number], [place], [place] [address] ?Phone: [phone]; Fax: [phone] ?07/13/2021 3:21 PM ? ? ?

## 2021-07-13 ENCOUNTER — Ambulatory Visit (INDEPENDENT_AMBULATORY_CARE_PROVIDER_SITE_OTHER): Payer: Medicare Other | Admitting: Pharmacist

## 2021-07-13 VITALS — BP 142/80 | HR 81

## 2021-07-13 DIAGNOSIS — E782 Mixed hyperlipidemia: Secondary | ICD-10-CM

## 2021-07-13 DIAGNOSIS — I10 Essential (primary) hypertension: Secondary | ICD-10-CM

## 2021-07-13 MED ORDER — METOPROLOL SUCCINATE ER 50 MG PO TB24: 50.0000 mg | ORAL_TABLET | Freq: Every day | ORAL | 5 refills | Status: DC

## 2021-07-13 MED ORDER — ROSUVASTATIN CALCIUM 20 MG PO TABS: 20.0000 mg | ORAL_TABLET | Freq: Every day | ORAL | 5 refills | Status: DC

## 2021-07-13 NOTE — Patient Instructions (Addendum)
Your blood pressure goal is <130/80  ? ?Continue olmesartan 20 mg daily. Will increase metoprolol succinate from 25 to 50 mg daily. ? ?Try to limit caffeine intake - can switch to caffeine free diet coke.  ? ?We will switch your atorvastatin to rosuvastatin (Crestor) 20 mg daily. Recheck fasting labs in June. ? ?

## 2021-07-20 ENCOUNTER — Encounter: Payer: Self-pay | Admitting: Pharmacist

## 2021-07-22 ENCOUNTER — Encounter: Payer: Self-pay | Admitting: Family Medicine

## 2021-07-29 ENCOUNTER — Other Ambulatory Visit: Payer: Medicare Other

## 2021-08-04 ENCOUNTER — Encounter: Payer: Self-pay | Admitting: Pharmacist

## 2021-08-04 MED ORDER — METOPROLOL SUCCINATE ER 100 MG PO TB24: 100.0000 mg | ORAL_TABLET | Freq: Every day | ORAL | 5 refills | Status: DC

## 2021-08-07 ENCOUNTER — Encounter: Payer: Self-pay | Admitting: Thoracic Surgery (Cardiothoracic Vascular Surgery)

## 2021-08-09 ENCOUNTER — Other Ambulatory Visit: Payer: Self-pay | Admitting: Internal Medicine

## 2021-08-28 ENCOUNTER — Other Ambulatory Visit: Payer: Self-pay | Admitting: Internal Medicine

## 2021-09-17 NOTE — Progress Notes (Signed)
Cardiology Office Note:    Date:  09/19/2021   ID:  Daniel Robinson, DOB 13-Nov-1950, MRN [id number]  PCP:  [name], Daniel G, MD   [facility] Providers Cardiologist:  [name], MD Referring MD: [name], Daniel G, MD   Chief Complaint/Reason for Referral:  Cardiology follow up  ASSESSMENT:    1. History of ascending aortic replacement   2. Primary hypertension   3. Mixed hyperlipidemia   4. Palpitations     PLAN:    In order of problems listed above: 1.  Ascending aortic replacement:.  This is being followed by cardiothoracic surgery.  Continue blood pressure control with beta-blocker and will continue aspirin.  Follow-up in 9 months or earlier if needed. 2.  Hypertension: His blood pressure is well controlled on his current regimen.  He is taking his Toprol at bedtime.  Given his episode of low blood pressure on Father's Day of asked him to take his Benicar at bedtime as well. 3.  Hyperlipidemia: Will follow up FLP and LFTs that was drawn today.  He has developed myalgias in response to atorvastatin, simvastatin, and now Crestor.  We will stop his Crestor and I will refer him to pharmacy for further recommendations   His goal LDL is <70 and <55 if possible. 4.  Palpitations:  Will place a monitor to evaluate.  Prior monitor demonstrated occasional SVT.    Dispo:  Return in about 9 months (around 06/20/2022).      Medication Adjustments/Labs and Tests Ordered: Current medicines are reviewed at length with the patient today.  Concerns regarding medicines are outlined above.  The following changes have been made:     Labs/tests ordered: Orders Placed This Encounter  Procedures   AMB Referral to [facility]   LONG TERM MONITOR (3-14 DAYS)    Medication Changes: Meds ordered this encounter  Medications   olmesartan (BENICAR) 20 MG tablet    Sig: Take 1 tablet (20 mg total) by mouth at bedtime.    Dispense:  90 tablet    Refill:  1    Changed direction  only - to take at bedtime.   metoprolol succinate (TOPROL-XL) 100 MG 24 hr tablet    Sig: Take 1 tablet (100 mg total) by mouth at bedtime. Take with or immediately following a meal.    Dispense:  90 tablet    Refill:  1     Current medicines are reviewed at length with the patient today.  The patient does not have concerns regarding medicines.   History of Present Illness:    FOCUSED PROBLEM LIST:   1.  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  Postoperative atrial fibrillation; transient not on A/C 3.  Hypertension 4.  Hyperlipidemia; with myalgias in response to atorvastatin and simvastatin  The patient is a 71 y.o. male with the indicated medical history here for cardiology follow-up.  December 2022: Patient was seen for initial consultation.  He had just returned from [place] after emergent ascending aortic dissection repair there.  He was referred for a monitor, Toprol XL 12.5 mg was initiated as well as simvastatin 20 mg.  He was seen by cardiothoracic surgery in December and will be monitored by them as well given his type A dissection.  Today: In the interim the patient's metoprolol had to be stopped due  to low blood pressures.  He was started on Toprol-XL 25 mg and seen in the pharmacy division.  Additionally due to myalgias he was switched from simvastatin to atorvastatin 40 mg.  His Toprol was increased to 50 mg by the pharmacy division.  Additionally due to myalgias with atorvastatin he was started on Crestor 20 mg.  The patient has been doing well.  His blood pressures well controlled at home.  He unfortunately has developed myalgias of his stomach as well as gastrointestinal distress in response to Crestor.  He has required no emergency room visits or hospitalizations.  He denies any chest pain,  shortness of breath, presyncope, or syncope.  He does tell me that on Father's Day he had a relatively low blood pressure which was abnormal for him.  He has had palpitations almost daily.  He tells me when he lays down for bed he he will feel them for a few minutes.  They are not associate with chest pain or shortness of breath.    Current Medications: Current Meds  Medication Sig   aspirin 81 MG EC tablet Take 81 mg by mouth every evening. Swallow whole.   [DISCONTINUED] metoprolol succinate (TOPROL-XL) 100 MG 24 hr tablet TAKE 1 TABLET BY MOUTH EVERY DAY   [DISCONTINUED] olmesartan (BENICAR) 20 MG tablet Take 1 tablet (20 mg total) by mouth daily.   [DISCONTINUED] rosuvastatin (CRESTOR) 20 MG tablet TAKE 1 TABLET BY MOUTH EVERY DAY     Allergies:    Atorvastatin, Crestor [rosuvastatin], and Quinolones   Social History:   Social History   Tobacco Use   Smoking status: Never   Smokeless tobacco: Never  Substance Use Topics   Alcohol use: Yes    Comment: socially   Drug use: No     Family Hx: Family History  Problem Relation Age of Onset   Hyperlipidemia Mother    Hypertension Mother      Review of Systems:   Please see the history of present illness.    All other systems reviewed and are negative.     EKGs/Labs/Other Test Reviewed:    EKG:  EKG performed December 2022 that I personally reviewed demonstrates sinus rhythm with nonspecific ST and T wave changes.  Prior CV studies: Monitor 2022 demonstrated sinus rhythm with rare brief SVT  Echocardiogram 2022 demonstrated ejection fraction of 50 to 55% with a 25 mm Edwards bioprosthetic valve in position with mean gradient 5 mmHg with no significant other valvular abnormalities  Other studies Reviewed: Review of the additional studies/records demonstrates: CT chest March 2023 demonstrates thoracic aortic aortic dissection extending to the proximal abdominal aorta  Recent Labs: 03/02/2021: BUN 13; Creatinine, Ser  0.79; Hemoglobin 11.6; Platelets 458.0; Potassium 4.5; Potassium 4.5; Sodium 137 05/30/2021: ALT 12   Recent Lipid Panel Lab Results  Component Value Date/Time   CHOL 179 05/30/2021 08:25 AM   TRIG 128 05/30/2021 08:25 AM   HDL 44 05/30/2021 08:25 AM   LDLCALC 112 (H) 05/30/2021 08:25 AM   LDLDIRECT 119.0 11/14/2017 09:25 AM    Risk Assessment/Calculations:           Physical Exam:    VS:  BP 128/70   Pulse 78   Ht 5\' 9"  (1.753 m)   Wt 174 lb 9.6 oz (79.2 kg)   SpO2 99%   BMI 25.78 kg/m    Wt Readings from Last 3 Encounters:  09/19/21 174 lb 9.6 oz (79.2 kg)  06/21/21 182 lb 4 oz (82.7 kg)  06/17/21 179 lb 2 oz (81.3 kg)    GENERAL:  No apparent distress, AOx3 HEENT:  No carotid bruits, +2 carotid impulses, no scleral icterus CAR: RRR  no murmurs, gallops, rubs, or thrills RES:  Clear to auscultation bilaterally ABD:  Soft, nontender, nondistended, positive bowel sounds x 4 VASC:  +2 radial pulses, +2 carotid pulses, palpable pedal pulses NEURO:  CN 2-12 grossly intact; motor and sensory grossly intact PSYCH:  No active depression or anxiety EXT:  No edema, ecchymosis, or cyanosis  Signed, Orbie Pyo, MD  09/19/2021 10:15 AM    [facility] [address] [place], [place], [place]  [address] Phone: [phone]; Fax: [phone]   Note:  This document was prepared using Dragon voice recognition software and may include unintentional dictation errors.

## 2021-09-19 ENCOUNTER — Ambulatory Visit (INDEPENDENT_AMBULATORY_CARE_PROVIDER_SITE_OTHER): Payer: Medicare Other

## 2021-09-19 ENCOUNTER — Ambulatory Visit (INDEPENDENT_AMBULATORY_CARE_PROVIDER_SITE_OTHER): Payer: Medicare Other | Admitting: Internal Medicine

## 2021-09-19 ENCOUNTER — Other Ambulatory Visit: Payer: Medicare Other

## 2021-09-19 ENCOUNTER — Encounter: Payer: Self-pay | Admitting: Internal Medicine

## 2021-09-19 VITALS — BP 128/70 | HR 78 | Ht 69.0 in | Wt 174.6 lb

## 2021-09-19 DIAGNOSIS — Z95828 Presence of other vascular implants and grafts: Secondary | ICD-10-CM

## 2021-09-19 DIAGNOSIS — E782 Mixed hyperlipidemia: Secondary | ICD-10-CM

## 2021-09-19 DIAGNOSIS — I10 Essential (primary) hypertension: Secondary | ICD-10-CM | POA: Diagnosis not present

## 2021-09-19 DIAGNOSIS — R002 Palpitations: Secondary | ICD-10-CM

## 2021-09-19 LAB — HEPATIC FUNCTION PANEL
ALT: 66 IU/L — ABNORMAL HIGH (ref 0–44)
AST: 30 IU/L (ref 0–40)
Albumin: 4 g/dL (ref 3.8–4.8)
Alkaline Phosphatase: 80 IU/L (ref 44–121)
Bilirubin Total: 0.4 mg/dL (ref 0.0–1.2)
Bilirubin, Direct: 0.12 mg/dL (ref 0.00–0.40)
Total Protein: 7.1 g/dL (ref 6.0–8.5)

## 2021-09-19 LAB — LIPID PANEL
Chol/HDL Ratio: 4.1 ratio (ref 0.0–5.0)
Cholesterol, Total: 134 mg/dL (ref 100–199)
HDL: 33 mg/dL — ABNORMAL LOW (ref >39)
LDL Chol Calc (NIH): 84 mg/dL (ref 0–99)
Triglycerides: 91 mg/dL (ref 0–149)
VLDL Cholesterol Cal: 17 mg/dL (ref 5–40)

## 2021-09-19 MED ORDER — OLMESARTAN MEDOXOMIL 20 MG PO TABS: 20.0000 mg | ORAL_TABLET | Freq: Every day | ORAL | 1 refills | Status: DC

## 2021-09-19 MED ORDER — METOPROLOL SUCCINATE ER 100 MG PO TB24: 100.0000 mg | ORAL_TABLET | Freq: Every day | ORAL | 1 refills | Status: DC

## 2021-09-19 NOTE — Progress Notes (Unsigned)
Enrolled for [employer] to mail a ZIO XT long term holter monitor to the patients address on file.  

## 2021-09-24 DIAGNOSIS — R002 Palpitations: Secondary | ICD-10-CM | POA: Diagnosis not present

## 2021-10-12 ENCOUNTER — Ambulatory Visit (INDEPENDENT_AMBULATORY_CARE_PROVIDER_SITE_OTHER): Payer: Medicare Other | Admitting: Pharmacist

## 2021-10-12 DIAGNOSIS — I10 Essential (primary) hypertension: Secondary | ICD-10-CM

## 2021-10-12 DIAGNOSIS — E782 Mixed hyperlipidemia: Secondary | ICD-10-CM | POA: Diagnosis not present

## 2021-10-12 MED ORDER — CARVEDILOL 6.25 MG PO TABS: 6.2500 mg | ORAL_TABLET | Freq: Two times a day (BID) | ORAL | 3 refills | Status: DC

## 2021-10-12 MED ORDER — SIMVASTATIN 20 MG PO TABS: 20.0000 mg | ORAL_TABLET | Freq: Every day | ORAL | 3 refills | Status: DC

## 2021-10-12 NOTE — Progress Notes (Signed)
Patient ID: HEBERT DOOLING                 DOB: [birth year]                    MRN: [id number]     HPI: Daniel Robinson is a 71 y.o. male patient referred to lipid clinic by Dr. Ali Lowe. 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 He initially was on simvastatin '20mg'$  daily. This was changed to atorvastatin after his dissection. He did not tolerate and atorvastatin was changed to rosuvastatin. Patient reported muscle pains across his chest and rosuvastatin was stopped.   He presents to clinic today. Reports that the muscle pains with rosuvastatin is subsiding. Still having abdominal pain, gas that moves around his abdomen. Worse at night, especially when he lies down. Has not seen his PCP yet about this. He feels like it is medication related. Started about 3 months ago when all his medications were changed around. He stats that he is in pain, tired and does feel like doing anything.  He has a Armed forces training and education officer.  Current Medications: none Intolerances: atorvastatin '40mg'$ , rosuvastatin '20mg'$   Risk Factors: HTN, aortic dissection LDL goal: <70 ApoB goal: <80  Diet: not discussed today  Exercise: completed cardiac rehab, is active. Use to take daily walks, hikes. Not much recently because he feels poorly  Family History:  Family History  Problem Relation Age of Onset   Hyperlipidemia Mother    Hypertension Mother     Social History:  Social History   Socioeconomic History   Marital status: Married    Spouse name: Not on file   Number of children: Not on file   Years of education: 14   Highest education level: Some college, no degree  Occupational History   Occupation: Retired  Tobacco Use   Smoking status: Never   Smokeless tobacco: Never  Scientific laboratory technician Use: Not on file  Substance and Sexual Activity   Alcohol use: Yes    Comment: socially   Drug use: No   Sexual activity: Yes  Other  Topics Concern   Not on file  Social History Narrative   Not on file   Social Determinants of Health   Financial Resource Strain: Low Risk  (06/13/2021)   Overall Financial Resource Strain (CARDIA)    Difficulty of Paying Living Expenses: Not hard at all  Food Insecurity: No Food Insecurity (06/13/2021)   Hunger Vital Sign    Worried About Running Out of Food in the Last Year: Never true    [place] in the Last Year: Never true  Transportation Needs: No Transportation Needs (06/13/2021)   PRAPARE - Hydrologist (Medical): No    Lack of Transportation (Non-Medical): No  Physical Activity: Sufficiently Active (06/13/2021)   Exercise Vital Sign    Days of Exercise per Week: 5 days    Minutes of Exercise per Session: 60 min  Stress: No Stress Concern Present (06/13/2021)   [place]    Feeling of Stress : Not at all  Social Connections: Moderately Isolated (06/13/2021)   Social Connection and Isolation Panel [NHANES]    Frequency of Communication with Friends and Family: More than three times a week    Frequency of Social Gatherings with Friends and Family: Once a week  Attends Religious Services: Never    Active Member of Clubs or Organizations: No    Attends Archivist Meetings: Never    Marital Status: Married  Human resources officer Violence: Not At Risk (06/13/2021)   Humiliation, Afraid, Rape, and Kick questionnaire    Fear of Current or Ex-Partner: No    Emotionally Abused: No    Physically Abused: No    Sexually Abused: No     Labs: 09/19/21 TC 134 TG 91 HDL 33 LDL 84 (rosuvastatin '20mg'$  daily) 05/30/21 TC 179, TG 128 HDL 44 LDL 112 (simvastatin '20mg'$  daily)  Past Medical History:  Diagnosis Date   Aortic dissection, thoracoabdominal ([place]) 01/2021   Hyperlipidemia    Hypertension     Current Outpatient Medications on File Prior to Visit  Medication Sig Dispense  Refill   aspirin 81 MG EC tablet Take 81 mg by mouth every evening. Swallow whole.     metoprolol succinate (TOPROL-XL) 100 MG 24 hr tablet Take 1 tablet (100 mg total) by mouth at bedtime. Take with or immediately following a meal. 90 tablet 1   olmesartan (BENICAR) 20 MG tablet Take 1 tablet (20 mg total) by mouth at bedtime. 90 tablet 1   No current facility-administered medications on file prior to visit.    Allergies  Allergen Reactions   Atorvastatin     Muscle pain and fatigue on '40mg'$  daily   Crestor [Rosuvastatin] Other (See Comments)    myalgias   Quinolones Other (See Comments)    Aortic dissection/ aneurysm    Assessment/Plan:  1. Hyperlipidemia - LDL-C is above goal of <70 (ideally <55). Patient tolerated simvastatin in the past. Did not tolerate high intensity statin. LDL-C will not get to goal with simvastatin and zetia. Discussed PCSK9i. Reviewed cost, injection technique, side effects and efficacy. Patient willing to try. Will resume simvastatin '20mg'$  daily since he tolerated fine in the past. I have asked him to hold off starting simvastatin for 1 week since he is having issue with metoprolol so that side effects are not confused. I will submit a prior authorization for Repatha.  2. HTN- Patient reports issues with metoprolol of gas and abdominal pain. Patient feels strongly that it is coming from his medication. Will stop metoprolol and start carvedilol 6.'25mg'$  BID. I have asked patient to keep an eye on his BP and HR with this switch. If no improvement in symptoms is seen, I encouraged him to see his PCP.  Thank you,  Ramond Dial, Pharm.D, BCPS, CPP [place]  [address] [place], [place] [address]  Phone: [phone]; Fax: [phone]

## 2021-10-12 NOTE — Patient Instructions (Addendum)
STOP metoprolol  Start carvedilol 6.'25mg'$  twice a day In 1 week, start simvastatin '20mg'$  daily I will submit a prior authorization for Repatha- I will call you once I hear back

## 2021-10-13 ENCOUNTER — Telehealth: Payer: Self-pay | Admitting: Pharmacist

## 2021-10-13 DIAGNOSIS — E782 Mixed hyperlipidemia: Secondary | ICD-10-CM

## 2021-10-13 MED ORDER — REPATHA SURECLICK 140 MG/ML ~~LOC~~ SOAJ: 1.0000 | SUBCUTANEOUS | 11 refills | Status: DC

## 2021-10-13 NOTE — Telephone Encounter (Signed)
Prior authorization approved for Repatha through 04/14/22. Rx sent to [facility] Called patient. LVM for him to call back. He will need repeat labs scheduled for 3 months.

## 2021-10-13 NOTE — Telephone Encounter (Signed)
Spoke with patient. Gave him instructions on how to get coapy card. He will delay start 1-2 weeks. Labs scheduled for Oct 4th.

## 2021-10-28 ENCOUNTER — Other Ambulatory Visit: Payer: Self-pay | Admitting: Thoracic Surgery (Cardiothoracic Vascular Surgery)

## 2021-10-28 DIAGNOSIS — I7103 Dissection of thoracoabdominal aorta: Secondary | ICD-10-CM

## 2021-11-02 ENCOUNTER — Telehealth: Payer: Self-pay | Admitting: Pharmacist

## 2021-11-02 NOTE — Telephone Encounter (Signed)
Called pt to see if the swap from metoprolol to carvedilol helped his symptoms. He says that he feels wonderful. He does not hurt, he has lots of energy, he feels great. Took his first Repatha injection that week and is feeing good. He will get labs done in Oct.

## 2021-11-11 ENCOUNTER — Encounter: Payer: Self-pay | Admitting: Pharmacist

## 2021-11-15 ENCOUNTER — Encounter: Payer: BLUE CROSS/BLUE SHIELD | Admitting: Thoracic Surgery (Cardiothoracic Vascular Surgery)

## 2021-12-20 ENCOUNTER — Other Ambulatory Visit: Payer: Self-pay | Admitting: Thoracic Surgery (Cardiothoracic Vascular Surgery)

## 2021-12-20 ENCOUNTER — Encounter: Payer: Self-pay | Admitting: Thoracic Surgery (Cardiothoracic Vascular Surgery)

## 2021-12-20 ENCOUNTER — Ambulatory Visit
Admission: RE | Admit: 2021-12-20 | Discharge: 2021-12-20 | Disposition: A | Discharge status: Home / Self Care | Payer: Medicare Other | Source: Ambulatory Visit | Attending: Thoracic Surgery (Cardiothoracic Vascular Surgery) | Admitting: Thoracic Surgery (Cardiothoracic Vascular Surgery)

## 2021-12-20 ENCOUNTER — Ambulatory Visit (INDEPENDENT_AMBULATORY_CARE_PROVIDER_SITE_OTHER): Payer: Medicare Other | Admitting: Thoracic Surgery (Cardiothoracic Vascular Surgery)

## 2021-12-20 VITALS — BP 170/79 | HR 58 | Resp 20 | Ht 69.0 in | Wt 185.0 lb

## 2021-12-20 DIAGNOSIS — Z9889 Other specified postprocedural states: Secondary | ICD-10-CM | POA: Diagnosis not present

## 2021-12-20 DIAGNOSIS — I7103 Dissection of thoracoabdominal aorta: Secondary | ICD-10-CM | POA: Diagnosis not present

## 2021-12-20 MED ORDER — IOPAMIDOL (ISOVUE-370) INJECTION 76%
75.0000 mL | Freq: Once | INTRAVENOUS | Status: AC | PRN
  Administered 2021-12-20: 75 mL via INTRAVENOUS

## 2021-12-20 NOTE — Progress Notes (Signed)
[address]       [place] [address]             [phone]     HPI: Daniel Robinson returns for follow-up after repair of a type I aortic dissection  Daniel Robinson is a 71 year old male with a history of hypertension, hyperlipidemia, and a type I aortic dissection.  He had the dissection while he was out in [place] hunting.  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  I saw him for the first time in March 2023.  He now returns for 25-monthfollow-up.  After I saw him in March he was having a few months where he did not feel well.  He had been tried on different blood pressure medications and also was having difficulty with Crestor.  He currently is on carvedilol, olmesartan, simvastatin and Repatha.  He has been feeling much better.  Past Medical History:  Diagnosis Date   Aortic dissection, thoracoabdominal ([place] 01/2021   Hyperlipidemia    Hypertension     Current Outpatient Medications  Medication Sig Dispense Refill   aspirin 81 MG EC tablet Take 81 mg by mouth every evening. Swallow whole.     carvedilol (COREG) 6.25 MG tablet Take 1 tablet (6.25 mg total) by mouth 2 (two) times daily. 180 tablet 3   Evolocumab (REPATHA SURECLICK) 1376MG/ML SOAJ Inject 1 Pen into the skin every 14 (fourteen) days. 2 mL 11   olmesartan (BENICAR) 20 MG tablet Take 1 tablet (20 mg total) by mouth at bedtime. 90 tablet 1   simvastatin (ZOCOR) 20 MG tablet Take 1 tablet (20 mg total) by mouth at bedtime. 90 tablet 3   No current facility-administered medications for this visit.    Physical Exam BP (!) 170/79   Pulse (!) 58   Resp 20   Ht '5\' 9"'$  (1.753 m)   Wt 185 lb (83.9 kg)   SpO2 97% Comment: RA  BMI 27.333kg/m  71year old man in no acute distress Well-developed and well-nourished Alert and oriented x3 with no focal deficits Lungs clear No carotid bruits Cardiac prominent impulse with no murmur Lungs  clear with equal breath sounds bilaterally No peripheral edema Pulses 2+ throughout  Diagnostic Tests: CT ANGIOGRAPHY CHEST, ABDOMEN AND PELVIS   TECHNIQUE: Non-contrast CT of the chest was initially obtained.   Multidetector CT imaging through the chest, abdomen and pelvis was performed using the standard protocol during bolus administration of intravenous contrast. Multiplanar reconstructed images and MIPs were obtained and reviewed to evaluate the vascular anatomy.   RADIATION DOSE REDUCTION: This exam was performed according to the departmental dose-optimization program which includes automated exposure control, adjustment of the mA and/or kV according to patient size and/or use of iterative reconstruction technique.   CONTRAST:  774mISOVUE-370 IOPAMIDOL (ISOVUE-370)   COMPARISON:  March 2023   FINDINGS: CTA CHEST   Cardiovascular: Preferential opacification of the thoracic aorta. Stable postsurgical changes of ascending thoracic aortic repair with tube graft and aortic valve replacement. Previously seen dissection flap involving the aortic arch and supra-branch vessels is no longer well visualized. Thrombosed false lumen of the right brachiocephalic artery results in total diameter measurement of 1.8 cm, previously 2.4 cm. Dissection flap within the left subclavian artery is no longer identified. Thrombosed false lumen surrounding the aortic arch results in maximum aortic dimension of 3.7 cm, previously measuring 3.7 cm, but with interval decreased  size of the false lumen. Decreased size of the false lumen in the descending thoracic aorta results in maximum aortic dimension of 3.4 cm, previously 3.9 cm. The pulmonary arteries are normal in caliber without proximal filling defect. Normal heart size. No pericardial effusion.   Mediastinum/Nodes: No enlarged mediastinal, hilar, or axillary lymph nodes. The thyroid gland appears normal.   Lungs/Pleura: No pleural  effusion. No pneumothorax. No mass or focal consolidation. No suspicious pulmonary nodules.   CTA ABDOMEN AND PELVIS   VASCULAR   Aorta: The false lumen becomes patent at the level of the diaphragmatic hiatus. The dissection extends into the right common iliac artery, and terminates in the proximal right external iliac artery. The abdominal aorta remains normal in caliber throughout.   Celiac: The dissection involves the origin of the celiac artery. The celiac artery and its distal branches remain widely patent.   SMA: Arises from the true lumen.  Patent without stenosis.   IMA: Arises from the false lumen.  Patent.   Renals: The main right renal artery arises from the true lumen and is patent without significant stenosis. There is an accessory right renal artery supplying the upper pole which arises from the false lumen. There is a single left renal artery which arises from the true lumen and is patent without significant stenosis.   Inflow: The dissection flap extends into the right common iliac artery which remains normal in caliber. The dissection flap terminates in the proximal right external iliac artery close to the iliac bifurcation. The left-sided iliac vessels are patent without significant stenosis.   Veins: No obvious venous abnormality within the limitations of this arterial phase study.   NON-VASCULAR   Hepatobiliary: The liver is normal in size. There is a 1.3 cm hypodense lesion along the peripheral aspect of the inferior right hepatic lobe, similar to previous exam measuring simple fluid attenuation, too small to definitively characterize but favored benign given above description. No intrahepatic or extrahepatic biliary ductal dilation. The gallbladder is decompressed.   Spleen: Normal in size without focal abnormality.   Pancreas: No pancreatic ductal dilatation or surrounding inflammatory changes.   Adrenals/Urinary Tract: Adrenal glands are  unremarkable. Kidneys are normal in size. Small exophytic left renal cyst measure simple fluid attenuation, too small to definitively characterize but favored benign. Bladder is unremarkable.   Stomach/Bowel: The stomach, small bowel and large bowel are normal in caliber without abnormal wall thickening or surrounding inflammatory changes. The appendix is normal.   Reproductive: Prostate is unremarkable.   Lymphatic: No enlarged lymph nodes in the abdomen or pelvis.   Other: No abdominopelvic ascites.   Musculoskeletal: No aggressive osseous lesions. Status post median sternotomy. The soft tissues are unremarkable.   Review of the MIP images confirms the above findings.   IMPRESSION: 1. Status post repair of ascending thoracic aorta with aortic valve replacement. Overall, previous findings of type A thoracic aortic dissection have improved within the chest. The thrombosed false within the thoracic aorta and the supra-aortic branch vessels has overall decreased in size, resulting in a reduction of thoracic aortic diameters as measured. The dissection flaps within the right common carotid artery and left subclavian artery are no longer visualized. 2. Overall stable extension of the thoracoabdominal dissection within the abdomen and pelvis. The false lumen becomes patent at the level of the diaphragmatic hiatus, with the dissection flap terminating in the proximal right external iliac artery. The abdominal aortic branch vessels are patent without significant stenosis, as described. Of note, the  celiac artery origin is involved by the dissection flap but remains patent, and an accessory right renal artery and the IMA arise from the false lumen. 3. No acute extravascular findings within the chest, abdomen and pelvis.     Electronically Signed   By: Albin Felling M.D.   On: 12/20/2021 11:50   I personally reviewed the CT images.  There has been some remodeling of the thoracic  aorta.  There is still a patent false lumen in the abdominal aorta.  No aneurysmal dilatation.  Impression: Daniel Robinson is a 71 year old male with a history of hypertension, hyperlipidemia, and a type I aortic dissection.  He had repair of a type I aortic dissection about a year ago while in [place] on a hunting trip.  Type I aortic dissection-status post hemiarch repair.  Some remodeling of the thoracic aorta and great vessels.  No residual flow in false lumen in the thoracic aorta.  Does have persistent flow in the false lumen in the abdominal aorta.  We will plan to rescan him in a year to check on progress and evaluate for aneurysmal degeneration.  Hypertension-blood pressure markedly elevated today with systolic of 696.  He said that happened the last time after he had contrast.  At home he has been running in the 120s.  Recommended he follow that closely.  I advised him to avoid extremely heavy lifting, but no restrictions on his activities otherwise.  Plan: Return in 1 year with CT and a gram of chest  I spent over 20 minutes in review of records, images, and consultation with Daniel Robinson today Melrose Nakayama, MD [facility] [phone]

## 2021-12-28 ENCOUNTER — Ambulatory Visit: Payer: Medicare Other | Attending: Internal Medicine

## 2021-12-28 DIAGNOSIS — E782 Mixed hyperlipidemia: Secondary | ICD-10-CM

## 2021-12-29 LAB — LIPID PANEL
Chol/HDL Ratio: 1.8 ratio (ref 0.0–5.0)
Cholesterol, Total: 93 mg/dL — ABNORMAL LOW (ref 100–199)
HDL: 51 mg/dL (ref >39)
LDL Chol Calc (NIH): 26 mg/dL (ref 0–99)
Triglycerides: 74 mg/dL (ref 0–149)
VLDL Cholesterol Cal: 16 mg/dL (ref 5–40)

## 2021-12-29 LAB — APOLIPOPROTEIN B: Apolipoprotein B: 39 mg/dL (ref <90)

## 2021-12-29 LAB — HEPATIC FUNCTION PANEL
ALT: 14 IU/L (ref 0–44)
AST: 15 IU/L (ref 0–40)
Albumin: 4.3 g/dL (ref 3.9–4.9)
Alkaline Phosphatase: 51 IU/L (ref 44–121)
Bilirubin Total: 0.4 mg/dL (ref 0.0–1.2)
Bilirubin, Direct: 0.15 mg/dL (ref 0.00–0.40)
Total Protein: 6.6 g/dL (ref 6.0–8.5)

## 2022-04-21 ENCOUNTER — Other Ambulatory Visit (HOSPITAL_COMMUNITY): Payer: Self-pay

## 2022-05-15 ENCOUNTER — Other Ambulatory Visit: Payer: Self-pay | Admitting: Internal Medicine

## 2022-05-22 ENCOUNTER — Ambulatory Visit: Payer: Medicare Other | Attending: Internal Medicine | Admitting: Internal Medicine

## 2022-05-22 ENCOUNTER — Encounter: Payer: Self-pay | Admitting: Internal Medicine

## 2022-05-22 VITALS — BP 134/82 | HR 56 | Ht 70.0 in | Wt 193.8 lb

## 2022-05-22 DIAGNOSIS — E785 Hyperlipidemia, unspecified: Secondary | ICD-10-CM | POA: Diagnosis not present

## 2022-05-22 DIAGNOSIS — I7103 Dissection of thoracoabdominal aorta: Secondary | ICD-10-CM | POA: Diagnosis not present

## 2022-05-22 DIAGNOSIS — I10 Essential (primary) hypertension: Secondary | ICD-10-CM

## 2022-05-22 NOTE — Patient Instructions (Addendum)
Medication Instructions:  Your physician has recommended you make the following change in your medication:  1-STOP simvastatin  *If you need a refill on your cardiac medications before your next appointment, please call your pharmacy*  Lab Work: Your physician recommends that you have lab work in 2 months- Lipid panel.  If you have labs (blood work) drawn today and your tests are completely normal, you will receive your results only by: [place] (if you have MyChart) OR A paper copy in the mail If you have any lab test that is abnormal or we need to change your treatment, we will call you to review the results.  Testing/Procedures: None ordered today.  Follow-Up: At [facility], you and your health needs are our priority.  As part of our continuing mission to provide you with exceptional heart care, we have created designated Provider Care Teams.  These Care Teams include your primary Cardiologist (physician) and Advanced Practice Providers (APPs -  Physician Assistants and Nurse Practitioners) who all work together to provide you with the care you need, when you need it.  We recommend signing up for the patient portal called "MyChart".  Sign up information is provided on this After Visit Summary.  MyChart is used to connect with patients for Virtual Visits (Telemedicine).  Patients are able to view lab/test results, encounter notes, upcoming appointments, etc.  Non-urgent messages can be sent to your provider as well.   To learn more about what you can do with MyChart, go to [email or web address].    Your next appointment:   12 month(s)  Provider:   PA or NP

## 2022-05-22 NOTE — Progress Notes (Signed)
Cardiology Office Note:    Date:  05/22/2022   ID:  Daniel Robinson, DOB 1950-10-18, MRN [id number]  PCP:  [name], [name], MD   [place] Providers Cardiologist:  [name], MD Referring MD: [name], [name], MD   Chief Complaint/Reason for Referral:  Cardiology follow up  ASSESSMENT:    1. Aortic dissection, thoracoabdominal ([place])   2. Primary hypertension   3. Hyperlipidemia LDL goal <70      PLAN:    In order of problems listed above: 1.  Ascending aortic replacement:.  This is being followed by cardiothoracic surgery.  Continue blood pressure control with beta-blocker and will continue aspirin.   2.  Hypertension: BP is well controlled today. 3.  Hyperlipidemia: LDL was 26 in October 2023; continue Repatha.  Stop Zocor.  Will check lipid panel in 2 months. 4.  Palpitations: Monitor showed reassuring results.    Dispo:  Return in about 1 year (around 05/23/2023).      Medication Adjustments/Labs and Tests Ordered: Current medicines are reviewed at length with the patient today.  Concerns regarding medicines are outlined above.  The following changes have been made:     Labs/tests ordered: Orders Placed This Encounter  Procedures   Lipid panel   EKG 12-Lead    Medication Changes: No orders of the defined types were placed in this encounter.    Current medicines are reviewed at length with the patient today.  The patient does not have concerns regarding medicines.   History of Present Illness:    FOCUSED PROBLEM LIST:   1.  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  Postoperative atrial fibrillation; transient not on A/C 3.  Hypertension 4.  Hyperlipidemia; with myalgias in response to atorvastatin and simvastatin  The patient is a 72 y.o. male with the  indicated medical history here for cardiology follow-up.  December 2022: Patient was seen for initial consultation.  He had just returned from [place] after emergent ascending aortic dissection repair there.  He was referred for a monitor, Toprol XL 12.5 mg was initiated as well as simvastatin 20 mg.  He was seen by cardiothoracic surgery in December and will be monitored by them as well given his type A dissection.  June 2023: In the interim the patient's metoprolol had to be stopped due to low blood pressures.  He was started on Toprol-XL 25 mg and seen in the pharmacy division.  Additionally due to myalgias he was switched from simvastatin to atorvastatin 40 mg.  His Toprol was increased to 50 mg by the pharmacy division.  Additionally due to myalgias with atorvastatin he was started on Crestor 20 mg.  The patient has been doing well.  His blood pressures well controlled at home.  He unfortunately has developed myalgias of his stomach as well as gastrointestinal distress in response to Crestor.  He has required no emergency room visits or hospitalizations.  He denies any chest pain, shortness of breath, presyncope, or syncope.  He does tell me that on Father's Day he had a relatively low blood pressure which was abnormal for him.  He has had palpitations almost daily.  He tells me when he lays down for bed he he will feel them for a few minutes.  They are not associate with chest pain or shortness of breath.  Plan:  Check lipid panel, LFTs, stop Crestor and refer to pharmacy for further recommendations; monitor for palpitations.  Today: In the interim the patient was seen by pharmacy and restarted on simvastatin 20 mg and his metoprolol was changed to carvedilol 6.25 mg twice daily.  He was ultimately started on Repatha.  He is doing very well.  He denies any cardiovascular complaints.  He has not been as active as he would like to be and is thinking about exercising more.  He is very happy with his  Repatha though he is also on simvastatin.  He is otherwise well without complaints.    Current Medications: Current Meds  Medication Sig   aspirin 81 MG EC tablet Take 81 mg by mouth every evening. Swallow whole.   carvedilol (COREG) 6.25 MG tablet Take 1 tablet (6.25 mg total) by mouth 2 (two) times daily.   Evolocumab (REPATHA SURECLICK) XX123456 MG/ML SOAJ Inject 1 Pen into the skin every 14 (fourteen) days.   olmesartan (BENICAR) 20 MG tablet TAKE 1 TABLET BY MOUTH EVERYDAY AT BEDTIME   [DISCONTINUED] simvastatin (ZOCOR) 20 MG tablet Take 1 tablet (20 mg total) by mouth at bedtime.     Allergies:    Atorvastatin, Crestor [rosuvastatin], and Quinolones   Social History:   Social History   Tobacco Use   Smoking status: Never   Smokeless tobacco: Never  Substance Use Topics   Alcohol use: Yes    Comment: socially   Drug use: No     Family Hx: Family History  Problem Relation Age of Onset   Hyperlipidemia Mother    Hypertension Mother      Review of Systems:   Please see the history of present illness.    All other systems reviewed and are negative.     EKGs/Labs/Other Test Reviewed:    EKG:  EKG performed December 2022 that I personally reviewed demonstrates sinus rhythm with nonspecific ST and T wave changes.  EKG done today that I personally reviewed demonstrates sinus bradycardia with nonspecific ST and T wave changes inferiorly  Prior CV studies:  Monitor 2023: 3 Supraventricular Tachycardia runs occurred, the run with the fastest interval lasting 10 beats with a max rate of 162 bpm (avg 146 bpm); the run with the fastest interval was also the longest. Isolated SVEs were rare (<1.0%), SVE Couplets were rare (<1.0%), and SVE Triplets were rare (<1.0%).    Isolated VEs were rare (<1.0%, 1300), VE Couplets were rare (<1.0%, 62), and VE Triplets were rare (<1.0%, 1).   No atrial fibrillation, ventricular tachyarrhythmias, or bradyarrhythmias were detected.   Patient  triggered events corresponded with sinus rhythm, ventricular ectopic beat, and supraventricular ectopic beats  Monitor 2022 demonstrated sinus rhythm with rare brief SVT  Echocardiogram 2022 demonstrated ejection fraction of 50 to 55% with a 25 mm Edwards bioprosthetic valve in position with mean gradient 5 mmHg with no significant other valvular abnormalities  Other studies Reviewed: Review of the additional studies/records demonstrates: CT chest March 2023 demonstrates thoracic aortic aortic dissection extending to the proximal abdominal aorta  Recent Labs: 12/28/2021: ALT 14   Recent Lipid Panel Lab Results  Component Value Date/Time   CHOL 93 (L) 12/28/2021 08:01 AM   TRIG 74 12/28/2021 08:01 AM   HDL 51 12/28/2021 08:01 AM   LDLCALC 26 12/28/2021 08:01 AM   LDLDIRECT 119.0 11/14/2017 09:25 AM    Risk Assessment/Calculations:           Physical Exam:    VS:  BP 134/82   Pulse (!) 56   Ht '5\' 10"'$  (1.778 m)   Wt 193 lb 12.8 oz (87.9 kg)   SpO2 97%   BMI 27.81 kg/m    Wt Readings from Last 3 Encounters:  05/22/22 193 lb 12.8 oz (87.9 kg)  12/20/21 185 lb (83.9 kg)  09/19/21 174 lb 9.6 oz (79.2 kg)    GENERAL:  No apparent distress, AOx3 HEENT:  No carotid bruits, +2 carotid impulses, no scleral icterus CAR: RRR no murmurs, gallops, rubs, or thrills; well healed sternal incision RES:  Clear to auscultation bilaterally ABD:  Soft, nontender, nondistended, positive bowel sounds x 4 VASC:  +2 radial pulses, +2 carotid pulses, palpable pedal pulses NEURO:  CN 2-12 grossly intact; motor and sensory grossly intact PSYCH:  No active depression or anxiety EXT:  No edema, ecchymosis, or cyanosis  Signed, [name], MD  05/22/2022 1:56 PM    [address], [place], [place]  [address] Phone: [phone]; Fax: [phone]   Note:  This document was prepared using Dragon voice recognition software and may include unintentional  dictation errors.

## 2022-06-08 NOTE — Progress Notes (Signed)
error 

## 2022-06-12 ENCOUNTER — Telehealth: Payer: Self-pay | Admitting: Family Medicine

## 2022-06-12 NOTE — Telephone Encounter (Signed)
Contacted Daniel Robinson to schedule their annual wellness visit. Appointment made for 06/28/22.  [name] direct phone # [phone]

## 2022-06-15 ENCOUNTER — Encounter: Payer: Medicare Other | Admitting: Family Medicine

## 2022-06-15 DIAGNOSIS — Z0000 Encounter for general adult medical examination without abnormal findings: Secondary | ICD-10-CM

## 2022-06-28 ENCOUNTER — Ambulatory Visit (INDEPENDENT_AMBULATORY_CARE_PROVIDER_SITE_OTHER): Payer: Medicare Other

## 2022-06-28 VITALS — Ht 70.0 in | Wt 193.0 lb

## 2022-06-28 DIAGNOSIS — Z0000 Encounter for general adult medical examination without abnormal findings: Secondary | ICD-10-CM

## 2022-06-28 NOTE — Patient Instructions (Addendum)
Daniel Robinson , Thank you for taking time to come for your Medicare Wellness Visit. I appreciate your ongoing commitment to your health goals. Please review the following plan we discussed and let me know if I can assist you in the future.   These are the goals we discussed:  Goals       Increase physical activity (pt-stated)      Stay healthy.        This is a list of the screening recommended for you and due dates:  Health Maintenance  Topic Date Due   COVID-19 Vaccine (1) 07/14/2022*   Zoster (Shingles) Vaccine (1 of 2) 09/27/2022*   Pneumonia Vaccine (2 of 2 - PCV) 06/28/2023*   Colon Cancer Screening  06/28/2023*   Flu Shot  10/26/2022   DTaP/Tdap/Td vaccine (2 - Td or Tdap) 12/21/2022   Medicare Annual Wellness Visit  06/28/2023   Hepatitis C Screening: USPSTF Recommendation to screen - Ages 18-79 yo.  Completed   HPV Vaccine  Aged Out  *Topic was postponed. The date shown is not the original due date.    Advanced directives: Advance directive discussed with you today. Even though you declined this today, please call our office should you change your mind, and we can give you the proper paperwork for you to fill out.   Conditions/risks identified: None  Next appointment: Follow up in one year for your annual wellness visit.   Preventive Care 70 Years and Older, Male  Preventive care refers to lifestyle choices and visits with your health care provider that can promote health and wellness. What does preventive care include? A yearly physical exam. This is also called an annual well check. Dental exams once or twice a year. Routine eye exams. Ask your health care provider how often you should have your eyes checked. Personal lifestyle choices, including: Daily care of your teeth and gums. Regular physical activity. Eating a healthy diet. Avoiding tobacco and drug use. Limiting alcohol use. Practicing safe sex. Taking low doses of aspirin every day. Taking vitamin  and mineral supplements as recommended by your health care provider. What happens during an annual well check? The services and screenings done by your health care provider during your annual well check will depend on your age, overall health, lifestyle risk factors, and family history of disease. Counseling  Your health care provider may ask you questions about your: Alcohol use. Tobacco use. Drug use. Emotional well-being. Home and relationship well-being. Sexual activity. Eating habits. History of falls. Memory and ability to understand (cognition). Work and work Statistician. Screening  You may have the following tests or measurements: Height, weight, and BMI. Blood pressure. Lipid and cholesterol levels. These may be checked every 5 years, or more frequently if you are over 56 years old. Skin check. Lung cancer screening. You may have this screening every year starting at age 65 if you have a 30-pack-year history of smoking and currently smoke or have quit within the past 15 years. Fecal occult blood test (FOBT) of the stool. You may have this test every year starting at age 23. Flexible sigmoidoscopy or colonoscopy. You may have a sigmoidoscopy every 5 years or a colonoscopy every 10 years starting at age 66. Prostate cancer screening. Recommendations will vary depending on your family history and other risks. Hepatitis C blood test. Hepatitis B blood test. Sexually transmitted disease (STD) testing. Diabetes screening. This is done by checking your blood sugar (glucose) after you have not eaten for a while (  fasting). You may have this done every 1-3 years. Abdominal aortic aneurysm (AAA) screening. You may need this if you are a current or former smoker. Osteoporosis. You may be screened starting at age 82 if you are at high risk. Talk with your health care provider about your test results, treatment options, and if necessary, the need for more tests. Vaccines  Your health care  provider may recommend certain vaccines, such as: Influenza vaccine. This is recommended every year. Tetanus, diphtheria, and acellular pertussis (Tdap, Td) vaccine. You may need a Td booster every 10 years. Zoster vaccine. You may need this after age 66. Pneumococcal 13-valent conjugate (PCV13) vaccine. One dose is recommended after age 59. Pneumococcal polysaccharide (PPSV23) vaccine. One dose is recommended after age 53. Talk to your health care provider about which screenings and vaccines you need and how often you need them. This information is not intended to replace advice given to you by your health care provider. Make sure you discuss any questions you have with your health care provider. Document Released: 04/09/2015 Document Revised: 12/01/2015 Document Reviewed: 01/12/2015 Elsevier Interactive Patient Education  2017 [employer] Prevention in the Home Falls can cause injuries. They can happen to people of all ages. There are many things you can do to make your home safe and to help prevent falls. What can I do on the outside of my home? Regularly fix the edges of walkways and driveways and fix any cracks. Remove anything that might make you trip as you walk through a door, such as a raised step or threshold. Trim any bushes or trees on the path to your home. Use bright outdoor lighting. Clear any walking paths of anything that might make someone trip, such as rocks or tools. Regularly check to see if handrails are loose or broken. Make sure that both sides of any steps have handrails. Any raised decks and porches should have guardrails on the edges. Have any leaves, snow, or ice cleared regularly. Use sand or salt on walking paths during winter. Clean up any spills in your garage right away. This includes oil or grease spills. What can I do in the bathroom? Use night lights. Install grab bars by the toilet and in the tub and shower. Do not use towel bars as grab  bars. Use non-skid mats or decals in the tub or shower. If you need to sit down in the shower, use a plastic, non-slip stool. Keep the floor dry. Clean up any water that spills on the floor as soon as it happens. Remove soap buildup in the tub or shower regularly. Attach bath mats securely with double-sided non-slip rug tape. Do not have throw rugs and other things on the floor that can make you trip. What can I do in the bedroom? Use night lights. Make sure that you have a light by your bed that is easy to reach. Do not use any sheets or blankets that are too big for your bed. They should not hang down onto the floor. Have a firm chair that has side arms. You can use this for support while you get dressed. Do not have throw rugs and other things on the floor that can make you trip. What can I do in the kitchen? Clean up any spills right away. Avoid walking on wet floors. Keep items that you use a lot in easy-to-reach places. If you need to reach something above you, use a strong step stool that has a grab bar.  Keep electrical cords out of the way. Do not use floor polish or wax that makes floors slippery. If you must use wax, use non-skid floor wax. Do not have throw rugs and other things on the floor that can make you trip. What can I do with my stairs? Do not leave any items on the stairs. Make sure that there are handrails on both sides of the stairs and use them. Fix handrails that are broken or loose. Make sure that handrails are as long as the stairways. Check any carpeting to make sure that it is firmly attached to the stairs. Fix any carpet that is loose or worn. Avoid having throw rugs at the top or bottom of the stairs. If you do have throw rugs, attach them to the floor with carpet tape. Make sure that you have a light switch at the top of the stairs and the bottom of the stairs. If you do not have them, ask someone to add them for you. What else can I do to help prevent  falls? Wear shoes that: Do not have high heels. Have rubber bottoms. Are comfortable and fit you well. Are closed at the toe. Do not wear sandals. If you use a stepladder: Make sure that it is fully opened. Do not climb a closed stepladder. Make sure that both sides of the stepladder are locked into place. Ask someone to hold it for you, if possible. Clearly mark and make sure that you can see: Any grab bars or handrails. First and last steps. Where the edge of each step is. Use tools that help you move around (mobility aids) if they are needed. These include: Canes. Walkers. Scooters. Crutches. Turn on the lights when you go into a dark area. Replace any light bulbs as soon as they burn out. Set up your furniture so you have a clear path. Avoid moving your furniture around. If any of your floors are uneven, fix them. If there are any pets around you, be aware of where they are. Review your medicines with your doctor. Some medicines can make you feel dizzy. This can increase your chance of falling. Ask your doctor what other things that you can do to help prevent falls. This information is not intended to replace advice given to you by your health care provider. Make sure you discuss any questions you have with your health care provider. Document Released: 01/07/2009 Document Revised: 08/19/2015 Document Reviewed: 04/17/2014 Elsevier Interactive Patient Education  2017 [employer].

## 2022-06-28 NOTE — Progress Notes (Signed)
Subjective:   Daniel Robinson is a 72 y.o. male who presents for Medicare Annual/Subsequent preventive examination.  Review of Systems    Virtual Visit via Telephone Note  I connected with  Daniel Robinson on 06/28/22 at  9:15 AM EDT by telephone and verified that I am speaking with the correct person using two identifiers.  Location: Patient: Home Provider: Office Persons participating in the virtual visit: patient/Nurse Health Advisor   I discussed the limitations, risks, security and privacy concerns of performing an evaluation and management service by telephone and the availability of in person appointments. The patient expressed understanding and agreed to proceed.  Interactive audio and video telecommunications were attempted between this nurse and patient, however failed, due to patient having technical difficulties OR patient did not have access to video capability.  We continued and completed visit with audio only.  Some vital signs may be absent or patient reported.   Criselda Peaches, LPN  Cardiac Risk Factors include: advanced age (>58men, >23 women);hypertension;male gender     Objective:    Today's Vitals   06/28/22 0922  Weight: 193 lb (87.5 kg)  Height: 5\' 10"  (1.778 m)   Body mass index is 27.69 kg/m.     06/28/2022    9:27 AM 06/13/2021   11:33 AM  Advanced Directives  Does Patient Have a Medical Advance Directive? No No  Would patient like information on creating a medical advance directive? No - Patient declined No - Patient declined    Current Medications (verified) Outpatient Encounter Medications as of 06/28/2022  Medication Sig   aspirin 81 MG EC tablet Take 81 mg by mouth every evening. Swallow whole.   carvedilol (COREG) 6.25 MG tablet Take 1 tablet (6.25 mg total) by mouth 2 (two) times daily.   Evolocumab (REPATHA SURECLICK) [id number] MG/ML SOAJ Inject 1 Pen into the skin every 14 (fourteen) days.   olmesartan (BENICAR) 20 MG tablet TAKE 1  TABLET BY MOUTH EVERYDAY AT BEDTIME   No facility-administered encounter medications on file as of 06/28/2022.    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   Hyperlipidemia    Hypertension    Past Surgical History:  Procedure Laterality Date   BACK SURGERY     REPAIR OF ACUTE ASCENDING THORACIC AORTIC DISSECTION  02/10/2021   INTRA-OP TEE, AORTIC VALVE REPLACEMENT, AORTIC ROOT REPLACEMENT, ASCENDING AORTA REPLACEMENT, STERNAL PLATING, PLACEMENT OF THE PREVENA INCISION WOUND MANAGEMENT SYSTEM   SHOULDER SURGERY     Family History  Problem Relation Age of Onset   Hyperlipidemia Mother    Hypertension Mother    Social History   Socioeconomic History   Marital status: Married    Spouse name: Not on file   Number of children: Not on file   Years of education: 14   Highest education level: Some college, no degree  Occupational History   Occupation: Retired  Tobacco Use   Smoking status: Never   Smokeless tobacco: Never  Scientific laboratory technician Use: Not on file  Substance and Sexual Activity   Alcohol use: Yes    Comment: socially   Drug use: No   Sexual activity: Yes  Other Topics Concern   Not on file  Social History Narrative   Not on file   Social Determinants of Health   Financial Resource Strain: Low Risk  (06/28/2022)   Overall Financial Resource Strain (CARDIA)  Difficulty of Paying Living Expenses: Not hard at all  Food Insecurity: No Food Insecurity (06/28/2022)   Hunger Vital Sign    Worried About Running Out of Food in the Last Year: Never true    Ran Out of Food in the Last Year: Never true  Transportation Needs: No Transportation Needs (06/28/2022)   PRAPARE - Hydrologist (Medical): No    Lack of Transportation (Non-Medical): No  Physical Activity: Inactive (06/28/2022)   Exercise Vital Sign    Days of  Exercise per Week: 0 days    Minutes of Exercise per Session: 0 min  Stress: No Stress Concern Present (06/28/2022)   Jacobus    Feeling of Stress : Not at all  Social Connections: Moderately Isolated (06/28/2022)   Social Connection and Isolation Panel [NHANES]    Frequency of Communication with Friends and Family: More than three times a week    Frequency of Social Gatherings with Friends and Family: More than three times a week    Attends Religious Services: Never    Marine scientist or Organizations: No    Attends Music therapist: Never    Marital Status: Married    Tobacco Counseling Counseling given: Not Answered   Clinical Intake:  Pre-visit preparation completed: Yes  Pain : No/denies pain     BMI - recorded: 27.69 Nutritional Status: BMI 25 -29 Overweight Nutritional Risks: None Diabetes: No  How often do you need to have someone help you when you read instructions, pamphlets, or other written materials from your doctor or pharmacy?: 1 - Never  Diabetic?  No  Interpreter Needed?: No  Information entered by :: Rolene Arbour LPN   Activities of Daily Living    06/28/2022    9:27 AM 06/24/2022   11:11 AM  In your present state of health, do you have any difficulty performing the following activities:  Hearing? 0 0  Vision? 0 0  Difficulty concentrating or making decisions? 0 0  Walking or climbing stairs? 0 0  Dressing or bathing? 0 0  Doing errands, shopping? 0 0  Preparing Food and eating ? N N  Using the Toilet? N N  In the past six months, have you accidently leaked urine? N N  Do you have problems with loss of bowel control? N N  Managing your Medications? N N  Managing your Finances? N N  Housekeeping or managing your Housekeeping? N N    Patient Care Team: [name], [name], MD as PCP - General (Family Medicine) [name], MD as PCP - Cardiology  (Cardiology)  Indicate any recent Medical Services you may have received from other than [facility] providers in the past year (date may be approximate).     Assessment:   This is a routine wellness examination for Daniel Robinson.  Hearing/Vision screen Hearing Screening - Comments:: Denies hearing difficulties   Vision Screening - Comments:: Wears rx glasses - up to date with routine eye exams with  Dr Joya San  Dietary issues and exercise activities discussed: Current Exercise Habits: The patient does not participate in regular exercise at present, Exercise limited by: None identified   Goals Addressed               This Visit's Progress     Increase physical activity (pt-stated)        Stay healthy.       Depression Screen  06/28/2022    9:26 AM 06/13/2021   11:29 AM 06/08/2021    7:50 AM 04/15/2021    9:30 AM 02/21/2021   10:40 AM 09/09/2019    8:24 AM 09/09/2019    8:23 AM  PHQ 2/9 Scores  PHQ - 2 Score 0 0 0 0 0 0 0  PHQ- 9 Score      0     Fall Risk    06/28/2022    9:27 AM 06/24/2022   11:11 AM 06/13/2021   11:32 AM 06/12/2021   11:44 AM 04/14/2021    1:34 PM  Fall Risk   Falls in the past year? 0 0 0 0 0  Number falls in past yr: 0  0  0  Injury with Fall? 0  0  0  Risk for fall due to : No Fall Risks  No Fall Risks  Other (Comment)  Risk for fall due to: Comment     Dizziness @ times  Follow up Falls prevention discussed    Falls evaluation completed    FALL RISK PREVENTION PERTAINING TO THE HOME:  Any stairs in or around the home? Yes  If so, are there any without handrails? No  Home free of loose throw rugs in walkways, pet beds, electrical cords, etc? Yes  Adequate lighting in your home to reduce risk of falls? Yes   ASSISTIVE DEVICES UTILIZED TO PREVENT FALLS:  Life alert? No  Use of a cane, walker or w/c? No  Grab bars in the bathroom? Yes  Shower chair or bench in shower? No  Elevated toilet seat or a handicapped toilet? Yes  TIMED UP AND GO:  Was the  test performed? No . Audio Visit   Cognitive Function:        06/28/2022    9:28 AM 06/13/2021   11:33 AM  6CIT Screen  What Year? 0 points 0 points  What month? 0 points 0 points  What time? 0 points 0 points  Count back from 20 0 points 0 points  Months in reverse 0 points 0 points  Repeat phrase 0 points 0 points  Total Score 0 points 0 points    Immunizations Immunization History  Administered Date(s) Administered   Influenza,inj,Quad PF,6+ Mos 12/20/2012, 01/13/2015   Pneumococcal Polysaccharide-23 09/09/2019   Tdap 12/20/2012    TDAP status: Up to date  Flu Vaccine status: Up to date  Pneumococcal vaccine status: Declined,  Education has been provided regarding the importance of this vaccine but patient still declined. Advised may receive this vaccine at local pharmacy or Health Dept. Aware to provide a copy of the vaccination record if obtained from local pharmacy or Health Dept. Verbalized acceptance and understanding.   Covid-19 vaccine status: Declined, Education has been provided regarding the importance of this vaccine but patient still declined. Advised may receive this vaccine at local pharmacy or Health Dept.or vaccine clinic. Aware to provide a copy of the vaccination record if obtained from local pharmacy or Health Dept. Verbalized acceptance and understanding.  Qualifies for Shingles Vaccine? Yes   Zostavax completed No   Shingrix Completed?: No.    Education has been provided regarding the importance of this vaccine. Patient has been advised to call insurance company to determine out of pocket expense if they have not yet received this vaccine. Advised may also receive vaccine at local pharmacy or Health Dept. Verbalized acceptance and understanding.  Screening Tests Health Maintenance  Topic Date Due   COVID-19 Vaccine (1) 07/14/2022 (  Originally 06/15/1951)   Zoster Vaccines- Shingrix (1 of 2) 09/27/2022 (Originally 12/17/2002)   Pneumonia Vaccine 65+ Years  old (2 of 2 - PCV) 06/28/2023 (Originally 09/08/2020)   COLONOSCOPY (Pts 45-69yrs Insurance coverage will need to be confirmed)  06/28/2023 (Originally 12/16/1997)   INFLUENZA VACCINE  10/26/2022   DTaP/Tdap/Td (2 - Td or Tdap) 12/21/2022   Medicare Annual Wellness (AWV)  06/28/2023   Hepatitis C Screening  Completed   HPV VACCINES  Aged Out    Health Maintenance  There are no preventive care reminders to display for this patient.   Colorectal cancer screening: Referral to GI placed Patient deferred. Pt aware the office will call re: appt.  Lung Cancer Screening: (Low Dose CT Chest recommended if Age 75-80 years, 30 pack-year currently smoking OR have quit w/in 15years.) does not qualify.    Additional Screening:  Hepatitis C Screening: does qualify; Completed 11/14/17  Vision Screening: Recommended annual ophthalmology exams for early detection of glaucoma and other disorders of the eye. Is the patient up to date with their annual eye exam?  Yes  Who is the provider or what is the name of the office in which the patient attends annual eye exams? Dr Joya San If pt is not established with a provider, would they like to be referred to a provider to establish care? No .   Dental Screening: Recommended annual dental exams for proper oral hygiene  Community Resource Referral / Chronic Care Management:  CRR required this visit?  No   CCM required this visit?  No      Plan:     I have personally reviewed and noted the following in the patient's chart:   Medical and social history Use of alcohol, tobacco or illicit drugs  Current medications and supplements including opioid prescriptions. Patient is not currently taking opioid prescriptions. Functional ability and status Nutritional status Physical activity Advanced directives List of other physicians Hospitalizations, surgeries, and ER visits in previous 12 months Vitals Screenings to include cognitive, depression, and  falls Referrals and appointments  In addition, I have reviewed and discussed with patient certain preventive protocols, quality metrics, and best practice recommendations. A written personalized care plan for preventive services as well as general preventive health recommendations were provided to patient.     Criselda Peaches, LPN   [id number]   Nurse Notes: None

## 2022-07-24 ENCOUNTER — Ambulatory Visit: Payer: Medicare Other | Attending: Internal Medicine

## 2022-07-24 DIAGNOSIS — E785 Hyperlipidemia, unspecified: Secondary | ICD-10-CM

## 2022-07-24 DIAGNOSIS — I10 Essential (primary) hypertension: Secondary | ICD-10-CM

## 2022-07-24 DIAGNOSIS — I7103 Dissection of thoracoabdominal aorta: Secondary | ICD-10-CM

## 2022-07-25 LAB — LIPID PANEL
Chol/HDL Ratio: 2.5 ratio (ref 0.0–5.0)
Cholesterol, Total: 121 mg/dL (ref 100–199)
HDL: 49 mg/dL (ref >39)
LDL Chol Calc (NIH): 48 mg/dL (ref 0–99)
Triglycerides: 142 mg/dL (ref 0–149)
VLDL Cholesterol Cal: 24 mg/dL (ref 5–40)

## 2022-08-12 ENCOUNTER — Other Ambulatory Visit: Payer: Self-pay | Admitting: Internal Medicine

## 2022-09-09 ENCOUNTER — Other Ambulatory Visit: Payer: Self-pay | Admitting: Internal Medicine

## 2022-09-30 ENCOUNTER — Other Ambulatory Visit: Payer: Self-pay | Admitting: Internal Medicine

## 2022-10-25 ENCOUNTER — Encounter (INDEPENDENT_AMBULATORY_CARE_PROVIDER_SITE_OTHER): Payer: Self-pay

## 2022-10-27 ENCOUNTER — Ambulatory Visit: Payer: BLUE CROSS/BLUE SHIELD | Admitting: Internal Medicine

## 2022-11-07 ENCOUNTER — Other Ambulatory Visit: Payer: Self-pay | Admitting: Thoracic Surgery (Cardiothoracic Vascular Surgery)

## 2022-11-07 DIAGNOSIS — I7103 Dissection of thoracoabdominal aorta: Secondary | ICD-10-CM

## 2022-11-10 NOTE — Progress Notes (Unsigned)
HPI: [name] is a 72 y.o.male with past medical history significant for hyperlipidemia, hypertension, thoracoabdominal aortic dissection status post repair of acute ascending thoracic aorta ***here today for his routine physical examination.  Last CPE: 2021  Regular exercise 3 or more times per week: *** Following a healthy diet: ***  Immunization History  Administered Date(s) Administered   Influenza,inj,Quad PF,6+ Mos 12/20/2012, 01/13/2015   Pneumococcal Polysaccharide-23 09/09/2019   Tdap 12/20/2012    Health Maintenance  Topic Date Due   INFLUENZA VACCINE  11/21/2022 (Originally 10/26/2022)   COVID-19 Vaccine (1 - 2023-24 season) 11/30/2022 (Originally 11/25/2021)   Zoster Vaccines- Shingrix (1 of 2) 03/30/2023 (Originally 12/17/2002)   Pneumonia Vaccine 83+ Years old (2 of 2 - PCV) 06/28/2023 (Originally 09/08/2020)   Colonoscopy  06/28/2023 (Originally 12/16/1997)   DTaP/Tdap/Td (2 - Td or Tdap) 12/21/2022   Medicare Annual Wellness (AWV)  06/28/2023   Hepatitis C Screening  Completed   HPV VACCINES  Aged Out   Last prostate ca screening: *** Lab Results  Component Value Date   PSA 1.96 11/14/2017   PSA 0.98 12/16/2012   HLD on Repatha 140 mg q 2 weeks. Follows with cardiologist annually, last visit 04/2022.  Lab Results  Component Value Date   CHOL 121 07/24/2022   HDL 49 07/24/2022   LDLCALC 48 07/24/2022   LDLDIRECT 119.0 11/14/2017   TRIG 142 07/24/2022   CHOLHDL 2.5 07/24/2022   HTN on carvedilol 6.25 mg twice daily and olmesartan 20 mg daily. He monitors his BP regularly, usually ***  Lab Results  Component Value Date   NA 137 03/02/2021   CL 105 03/02/2021   K 4.5 03/02/2021   K 4.5 03/02/2021   CO2 28 03/02/2021   BUN 13 03/02/2021   CREATININE 0.79 03/02/2021   GFR 91.47 03/02/2021   CALCIUM 9.2 03/02/2021   ALBUMIN 4.3 12/28/2021   GLUCOSE 78 03/02/2021   Review of Systems  Constitutional:  Negative for activity change,  appetite change and fever.  HENT:  Negative for dental problem, nosebleeds, sore throat and trouble swallowing.   Eyes:  Negative for redness and visual disturbance.  Respiratory:  Negative for cough, shortness of breath and wheezing.   Cardiovascular:  Negative for chest pain, palpitations and leg swelling.  Gastrointestinal:  Negative for abdominal pain, blood in stool, nausea and vomiting.  Endocrine: Negative for cold intolerance, heat intolerance, polydipsia, polyphagia and polyuria.  Genitourinary:  Negative for decreased urine volume, dysuria, genital sores, hematuria and testicular pain.  Musculoskeletal:  Negative for gait problem and myalgias.  Skin:  Negative for color change and rash.  Allergic/Immunologic: Negative for environmental allergies.  Neurological:  Negative for syncope, weakness and headaches.  Hematological:  Negative for adenopathy. Does not bruise/bleed easily.  Psychiatric/Behavioral:  Negative for confusion and sleep disturbance. The patient is not nervous/anxious.    Current Outpatient Medications on File Prior to Visit  Medication Sig Dispense Refill   aspirin 81 MG EC tablet Take 81 mg by mouth every evening. Swallow whole.     carvedilol (COREG) 6.25 MG tablet TAKE 1 TABLET BY MOUTH TWICE A DAY 180 tablet 2   Evolocumab (REPATHA SURECLICK) 140 MG/ML SOAJ Inject 140 mg into the skin every 14 (fourteen) days. 6 mL 3   olmesartan (BENICAR) 20 MG tablet TAKE 1 TABLET BY MOUTH EVERYDAY AT BEDTIME 90 tablet 3   No current facility-administered medications on file prior to visit.   Past Medical History:  Diagnosis Date  Aortic dissection, thoracoabdominal (HCC) 01/2021   Hyperlipidemia    Hypertension    Routine general medical examination at a health care facility 11/14/2022   Past Surgical History:  Procedure Laterality Date   BACK SURGERY     CARDIAC VALVE REPLACEMENT     REPAIR OF ACUTE ASCENDING THORACIC AORTIC DISSECTION  02/10/2021   INTRA-OP TEE,  AORTIC VALVE REPLACEMENT, AORTIC ROOT REPLACEMENT, ASCENDING AORTA REPLACEMENT, STERNAL PLATING, PLACEMENT OF THE PREVENA INCISION WOUND MANAGEMENT SYSTEM   SHOULDER SURGERY      Allergies  Allergen Reactions   Atorvastatin     Muscle pain and fatigue on 40mg  daily   Crestor [Rosuvastatin] Other (See Comments)    myalgias   Quinolones Other (See Comments)    Aortic dissection/ aneurysm    Family History  Problem Relation Age of Onset   Hyperlipidemia Mother    Hypertension Mother     Social History   Socioeconomic History   Marital status: Married    Spouse name: Not on file   Number of children: Not on file   Years of education: 14   Highest education level: Some college, no degree  Occupational History   Occupation: Retired  Tobacco Use   Smoking status: Never   Smokeless tobacco: Never  Vaping Use   Vaping status: Not on file  Substance and Sexual Activity   Alcohol use: Not Currently    Comment: socially   Drug use: Never   Sexual activity: Not Currently    Birth control/protection: None  Other Topics Concern   Not on file  Social History Narrative   Not on file   Social Determinants of Health   Financial Resource Strain: Low Risk  (06/28/2022)   Overall Financial Resource Strain (CARDIA)    Difficulty of Paying Living Expenses: Not hard at all  Food Insecurity: No Food Insecurity (06/28/2022)   Hunger Vital Sign    Worried About Running Out of Food in the Last Year: Never true    Ran Out of Food in the Last Year: Never true  Transportation Needs: No Transportation Needs (06/28/2022)   PRAPARE - Administrator, Civil Service (Medical): No    Lack of Transportation (Non-Medical): No  Physical Activity: Inactive (06/28/2022)   Exercise Vital Sign    Days of Exercise per Week: 0 days    Minutes of Exercise per Session: 0 min  Stress: No Stress Concern Present (06/28/2022)   [employer] of Occupational Health - Occupational Stress Questionnaire     Feeling of Stress : Not at all  Social Connections: Moderately Isolated (06/28/2022)   Social Connection and Isolation Panel [NHANES]    Frequency of Communication with Friends and Family: More than three times a week    Frequency of Social Gatherings with Friends and Family: More than three times a week    Attends Religious Services: Never    Database administrator or Organizations: No    Attends Banker Meetings: Never    Marital Status: Married   Vitals:   11/14/22 1025  BP: 120/78  Pulse: 60  Resp: 16  Temp: 98 F (36.7 C)  SpO2: 95%   Body mass index is 27.98 kg/m.  Wt Readings from Last 3 Encounters:  11/14/22 195 lb (88.5 kg)  06/28/22 193 lb (87.5 kg)  05/22/22 193 lb 12.8 oz (87.9 kg)   Physical Exam Vitals and nursing note reviewed.  Constitutional:      General: He is not in  acute distress.    Appearance: He is well-developed.  HENT:     Head: Normocephalic and atraumatic.     Right Ear: Tympanic membrane, ear canal and external ear normal.     Left Ear: Tympanic membrane, ear canal and external ear normal.     Mouth/Throat:     Mouth: Mucous membranes are moist.     Pharynx: Oropharynx is clear.  Eyes:     Extraocular Movements: Extraocular movements intact.     Conjunctiva/sclera: Conjunctivae normal.     Pupils: Pupils are equal, round, and reactive to light.  Neck:     Thyroid: No thyroid mass or thyromegaly.  Cardiovascular:     Rate and Rhythm: Normal rate and regular rhythm.     Pulses:          Dorsalis pedis pulses are 2+ on the right side and 2+ on the left side.     Heart sounds: Murmur (Soft SEM LUSB and RUSB) heard.  Pulmonary:     Effort: Pulmonary effort is normal. No respiratory distress.     Breath sounds: Normal breath sounds.  Abdominal:     Palpations: Abdomen is soft. There is no hepatomegaly or mass.     Tenderness: There is no abdominal tenderness.  Genitourinary:    Comments: No concerns. Musculoskeletal:         General: No tenderness.     Cervical back: Normal range of motion.     Comments: No major deformities appreciated and no signs of synovitis.  Lymphadenopathy:     Cervical: No cervical adenopathy.     Upper Body:     Right upper body: No supraclavicular adenopathy.     Left upper body: No supraclavicular adenopathy.  Skin:    General: Skin is warm.     Findings: No erythema or rash.  Neurological:     General: No focal deficit present.     Mental Status: He is alert and oriented to person, place, and time.     Cranial Nerves: No cranial nerve deficit.     Sensory: No sensory deficit.     Gait: Gait normal.     Deep Tendon Reflexes:     Reflex Scores:      Bicep reflexes are 2+ on the right side and 2+ on the left side.      Patellar reflexes are 2+ on the right side and 2+ on the left side. Psychiatric:        Mood and Affect: Mood and affect normal.   ASSESSMENT AND PLAN:  Daniel Robinson was seen today for annual exam.  Diagnoses and all orders for this visit:  Orders Placed This Encounter  Procedures   Comprehensive metabolic panel   PSA    Routine general medical examination at a health care facility Assessment & Plan: We discussed the importance of regular physical activity and healthy diet for prevention of chronic illness and/or complications. Preventive guidelines reviewed. Declined colon cancer screening and vaccinations. Next CPE in a year.   Primary hypertension Assessment & Plan: BP adequately controlled. Continue monitoring BP at home. Currently on carvedilol 6.25 mg twice daily and olmesartan 20 mg daily. Continue low salt/DASH diet.  Orders: -     Comprehensive metabolic panel; Future  Mixed hyperlipidemia Assessment & Plan: LDL 48 in 06/2022. He has not tolerated statins. Currently on Repatha 140 mg every 2 weeks. Following with cardiologist.  Orders: -     Comprehensive metabolic panel; Future  Urinary frequency -  PSA;  Future   Return in 1 year (on 11/14/2023) for CPE, chronic problems.  Daniel Thumm G. [name], MD  [facility]. [place] office.

## 2022-11-14 ENCOUNTER — Encounter: Payer: Self-pay | Admitting: Family Medicine

## 2022-11-14 ENCOUNTER — Ambulatory Visit (INDEPENDENT_AMBULATORY_CARE_PROVIDER_SITE_OTHER): Payer: Medicare Other | Admitting: Family Medicine

## 2022-11-14 VITALS — BP 120/78 | HR 60 | Temp 98.0°F | Resp 16 | Ht 70.0 in | Wt 195.0 lb

## 2022-11-14 DIAGNOSIS — I10 Essential (primary) hypertension: Secondary | ICD-10-CM | POA: Diagnosis not present

## 2022-11-14 DIAGNOSIS — E782 Mixed hyperlipidemia: Secondary | ICD-10-CM

## 2022-11-14 DIAGNOSIS — Z0000 Encounter for general adult medical examination without abnormal findings: Secondary | ICD-10-CM | POA: Diagnosis not present

## 2022-11-14 DIAGNOSIS — R350 Frequency of micturition: Secondary | ICD-10-CM

## 2022-11-14 HISTORY — DX: Encounter for general adult medical examination without abnormal findings: Z00.00

## 2022-11-14 LAB — COMPREHENSIVE METABOLIC PANEL
ALT: 17 U/L (ref 0–53)
AST: 18 U/L (ref 0–37)
Albumin: 4.4 g/dL (ref 3.5–5.2)
Alkaline Phosphatase: 38 U/L — ABNORMAL LOW (ref 39–117)
BUN: 17 mg/dL (ref 6–23)
CO2: 26 mEq/L (ref 19–32)
Calcium: 9.1 mg/dL (ref 8.4–10.5)
Chloride: 105 mEq/L (ref 96–112)
Creatinine, Ser: 0.75 mg/dL (ref 0.40–1.50)
GFR: 91.82 mL/min (ref >60.00)
Glucose, Bld: 78 mg/dL (ref 70–99)
Potassium: 4.3 mEq/L (ref 3.5–5.1)
Sodium: 140 mEq/L (ref 135–145)
Total Bilirubin: 0.7 mg/dL (ref 0.2–1.2)
Total Protein: 7.2 g/dL (ref 6.0–8.3)

## 2022-11-14 LAB — PSA: PSA: 0.79 ng/mL (ref 0.10–4.00)

## 2022-11-14 NOTE — Assessment & Plan Note (Signed)
We discussed the importance of regular physical activity and healthy diet for prevention of chronic illness and/or complications. Preventive guidelines reviewed. Declined colon cancer screening and vaccinations. Next CPE in a year.

## 2022-11-14 NOTE — Assessment & Plan Note (Signed)
BP adequately controlled. Continue monitoring BP at home. Currently on carvedilol 6.25 mg twice daily and olmesartan 20 mg daily. Continue low salt/DASH diet.

## 2022-11-14 NOTE — Assessment & Plan Note (Signed)
LDL 48 in 06/2022. He has not tolerated statins. Currently on Repatha 140 mg every 2 weeks. Following with cardiologist.

## 2022-11-14 NOTE — Patient Instructions (Addendum)
A few things to remember from today's visit:  Routine general medical examination at a health care facility  Primary hypertension - Plan: Comprehensive metabolic panel  Mixed hyperlipidemia - Plan: Comprehensive metabolic panel  Urinary frequency - Plan: PSA No changes today.  If you need refills for medications you take chronically, please call your pharmacy. Do not use My Chart to request refills or for acute issues that need immediate attention. If you send a my chart message, it may take a few days to be addressed, specially if I am not in the office.  Please be sure medication list is accurate. If a new problem present, please set up appointment sooner than planned today.

## 2022-12-07 ENCOUNTER — Emergency Department (HOSPITAL_COMMUNITY)
Admission: EM | Admit: 2022-12-07 | Discharge: 2022-12-07 | Disposition: A | Discharge status: Home / Self Care | Payer: Medicare Other | Attending: Emergency Medicine | Admitting: Emergency Medicine

## 2022-12-07 ENCOUNTER — Emergency Department (HOSPITAL_COMMUNITY): Payer: Medicare Other

## 2022-12-07 ENCOUNTER — Encounter (HOSPITAL_COMMUNITY): Payer: Self-pay

## 2022-12-07 DIAGNOSIS — R42 Dizziness and giddiness: Secondary | ICD-10-CM | POA: Diagnosis not present

## 2022-12-07 DIAGNOSIS — Z952 Presence of prosthetic heart valve: Secondary | ICD-10-CM | POA: Insufficient documentation

## 2022-12-07 DIAGNOSIS — R55 Syncope and collapse: Secondary | ICD-10-CM | POA: Insufficient documentation

## 2022-12-07 DIAGNOSIS — R011 Cardiac murmur, unspecified: Secondary | ICD-10-CM | POA: Insufficient documentation

## 2022-12-07 DIAGNOSIS — Z7982 Long term (current) use of aspirin: Secondary | ICD-10-CM | POA: Insufficient documentation

## 2022-12-07 DIAGNOSIS — I10 Essential (primary) hypertension: Secondary | ICD-10-CM | POA: Insufficient documentation

## 2022-12-07 DIAGNOSIS — Z79899 Other long term (current) drug therapy: Secondary | ICD-10-CM | POA: Insufficient documentation

## 2022-12-07 DIAGNOSIS — R008 Other abnormalities of heart beat: Secondary | ICD-10-CM | POA: Insufficient documentation

## 2022-12-07 DIAGNOSIS — I493 Ventricular premature depolarization: Secondary | ICD-10-CM | POA: Diagnosis not present

## 2022-12-07 LAB — CBC WITH DIFFERENTIAL/PLATELET
Abs Immature Granulocytes: 0.01 10*3/uL (ref 0.00–0.07)
Basophils Absolute: 0 10*3/uL (ref 0.0–0.1)
Basophils Relative: 0 %
Eosinophils Absolute: 0.2 10*3/uL (ref 0.0–0.5)
Eosinophils Relative: 3 %
HCT: 38.3 % — ABNORMAL LOW (ref 39.0–52.0)
Hemoglobin: 12.6 g/dL — ABNORMAL LOW (ref 13.0–17.0)
Immature Granulocytes: 0 %
Lymphocytes Relative: 33 %
Lymphs Abs: 2.5 10*3/uL (ref 0.7–4.0)
MCH: 29.7 pg (ref 26.0–34.0)
MCHC: 32.9 g/dL (ref 30.0–36.0)
MCV: 90.3 fL (ref 80.0–100.0)
Monocytes Absolute: 0.6 10*3/uL (ref 0.1–1.0)
Monocytes Relative: 8 %
Neutro Abs: 4.3 10*3/uL (ref 1.7–7.7)
Neutrophils Relative %: 56 %
Platelets: 226 10*3/uL (ref 150–400)
RBC: 4.24 MIL/uL (ref 4.22–5.81)
RDW: 13.3 % (ref 11.5–15.5)
WBC: 7.6 10*3/uL (ref 4.0–10.5)
nRBC: 0 % (ref 0.0–0.2)

## 2022-12-07 LAB — BASIC METABOLIC PANEL
Anion gap: 12 (ref 5–15)
BUN: 18 mg/dL (ref 8–23)
CO2: 22 mmol/L (ref 22–32)
Calcium: 9.2 mg/dL (ref 8.9–10.3)
Chloride: 103 mmol/L (ref 98–111)
Creatinine, Ser: 1.01 mg/dL (ref 0.61–1.24)
GFR, Estimated: >60 mL/min (ref >60)
Glucose, Bld: 98 mg/dL (ref 70–99)
Potassium: 4.3 mmol/L (ref 3.5–5.1)
Sodium: 137 mmol/L (ref 135–145)

## 2022-12-07 LAB — TROPONIN I (HIGH SENSITIVITY)
Troponin I (High Sensitivity): 5 ng/L (ref <18)
Troponin I (High Sensitivity): 5 ng/L (ref <18)

## 2022-12-07 LAB — BRAIN NATRIURETIC PEPTIDE: B Natriuretic Peptide: 74.7 pg/mL (ref 0.0–100.0)

## 2022-12-07 LAB — MAGNESIUM: Magnesium: 2.3 mg/dL (ref 1.7–2.4)

## 2022-12-07 MED ORDER — IOHEXOL 350 MG/ML SOLN
100.0000 mL | Freq: Once | INTRAVENOUS | Status: AC | PRN
  Administered 2022-12-07: 100 mL via INTRAVENOUS

## 2022-12-07 MED ORDER — LACTATED RINGERS IV BOLUS
1000.0000 mL | Freq: Once | INTRAVENOUS | Status: AC
  Administered 2022-12-07: 1000 mL via INTRAVENOUS

## 2022-12-07 NOTE — ED Triage Notes (Signed)
PT BIB EMS for near syncope, dizziness, Blood pressure and heart rate changes since working out yesterday, used the treadmill and stair climber, had frequent PVC's on EMS monitor.    HX: dissection aortic aneurysm  Patient has not complained of any pain or discomfor  BP varies 160/ --107/ CBG 101 18 g L AC

## 2022-12-07 NOTE — ED Provider Notes (Signed)
[place] EMERGENCY DEPARTMENT AT [facility] Provider Note   CSN: [id number] Arrival date & time: 12/07/22  1739     History  Chief Complaint  Patient presents with   Near Syncope    Daniel Robinson is a 72 y.o. male.   Near Syncope  Patient reports that yesterday after working out he noticed increased feelings of lightheadedness.  He initially attributed this to a difficult workout however his symptoms persisted today with light exertion therefore he decided to come to the emergency department to be evaluated.  He reports that he did check his own blood pressure and heart rate at home and these were highly variable.  Denies chest pain or shortness of breath.     Home Medications Prior to Admission medications   Medication Sig Start Date End Date Taking? Authorizing Provider  aspirin 81 MG EC tablet Take 81 mg by mouth every evening. Swallow whole.    [provider]  carvedilol (COREG) 6.25 MG tablet TAKE 1 TABLET BY MOUTH TWICE A DAY 10/02/22   Orbie Pyo, MD  Evolocumab (REPATHA SURECLICK) 140 MG/ML SOAJ Inject 140 mg into the skin every 14 (fourteen) days. 09/11/22   Orbie Pyo, MD  olmesartan (BENICAR) 20 MG tablet TAKE 1 TABLET BY MOUTH EVERYDAY AT BEDTIME 08/14/22   Orbie Pyo, MD      Allergies    Atorvastatin, Crestor [rosuvastatin], and Quinolones    Review of Systems   Review of Systems  Cardiovascular:  Positive for near-syncope.    Physical Exam Updated Vital Signs BP 110/87 (BP Location: Right Arm)   Pulse (!) 55   Temp 98.3 F (36.8 C) (Oral)   Resp 17   SpO2 98%  Physical Exam Vitals and nursing note reviewed.  Constitutional:      General: He is not in acute distress.    Appearance: He is well-developed.  HENT:     Head: Normocephalic and atraumatic.  Eyes:     Conjunctiva/sclera: Conjunctivae normal.  Cardiovascular:     Rate and Rhythm: Normal rate. Rhythm irregular.     Heart sounds: Murmur heard.   Pulmonary:     Effort: Pulmonary effort is normal. No respiratory distress.     Breath sounds: Normal breath sounds.  Abdominal:     Palpations: Abdomen is soft.     Tenderness: There is no abdominal tenderness.  Musculoskeletal:        General: No swelling.     Cervical back: Neck supple.  Skin:    General: Skin is warm and dry.     Capillary Refill: Capillary refill takes less than 2 seconds.  Neurological:     Mental Status: He is alert.  Psychiatric:        Mood and Affect: Mood normal.     ED Results / Procedures / Treatments   Labs (all labs ordered are listed, but only abnormal results are displayed) Labs Reviewed  CBC WITH DIFFERENTIAL/PLATELET - Abnormal; Notable for the following components:      Result Value   Hemoglobin 12.6 (*)    HCT 38.3 (*)    All other components within normal limits  BASIC METABOLIC PANEL  MAGNESIUM  BRAIN NATRIURETIC PEPTIDE  TROPONIN I (HIGH SENSITIVITY)  TROPONIN I (HIGH SENSITIVITY)    EKG EKG Interpretation Date/Time:  Thursday December 07 2022 17:51:11 EDT Ventricular Rate:  59 PR Interval:  170 QRS Duration:  98 QT Interval:  411 QTC Calculation: 408 R Axis:   -  30  Text Interpretation: Sinus rhythm Ventricular trigeminy Probable left atrial enlargement Left axis deviation Confirmed by Lorre Nick ([id number]) on 12/07/2022 6:27:47 PM  Radiology CT Angio Chest/Abd/Pel for Dissection W and/or W/WO  Result Date: 12/07/2022 CLINICAL DATA:  Near syncope, dizziness, prior thoracic aortic dissection EXAM: CT ANGIOGRAPHY CHEST, ABDOMEN AND PELVIS TECHNIQUE: Non-contrast CT of the chest was initially obtained. Multidetector CT imaging through the chest, abdomen and pelvis was performed using the standard protocol during bolus administration of intravenous contrast. Multiplanar reconstructed images and MIPs were obtained and reviewed to evaluate the vascular anatomy. RADIATION DOSE REDUCTION: This exam was performed according to the  departmental dose-optimization program which includes automated exposure control, adjustment of the mA and/or kV according to patient size and/or use of iterative reconstruction technique. CONTRAST:  OMNIPAQUE IOHEXOL 350 MG/ML SOLN COMPARISON:  12/20/2021 FINDINGS: CTA CHEST FINDINGS Cardiovascular: Status post aortic valve replacement ascending arch graft. On unenhanced CT, there is no evidence of intramural hematoma. Following contrast administration, the prior dissection just distal to the graft is again not visualized, with a suspected thrombosed false lumen. However, in the descending thoracic aorta just above the aortic hiatus the dissection flap remains visible (series 6/image 183). This appearance is unchanged from the prior. Although not tailored for evaluation of the pulmonary arteries, there is no evidence of pulmonary embolism to the lobar level. The heart is normal in size.  No pericardial effusion. Moderate coronary atherosclerosis of the LAD and right coronary artery. Mediastinum/Nodes: No suspicious mediastinal lymphadenopathy. Visualized thyroid is unremarkable. Lungs/Pleura: Mild biapical pleural-parenchymal scarring. No suspicious pulmonary nodules. Stable 5 x 3 mm triangular subpleural nodule in the anterior right lower lobe along the major fissure (series 7/image 119), benign. No focal consolidation. No pleural effusion or pneumothorax. Musculoskeletal: Thoracic spine is within normal limits. Median sternotomy. Review of the MIP images confirms the above findings. CTA ABDOMEN AND PELVIS FINDINGS VASCULAR Aorta: Patent.  Chronic dissection.  Atherosclerotic calcifications. Celiac: Patent, arising predominantly from the true lumen. SMA: Patent, arising from the true lumen. Renals: Patent bilaterally, both arising from the true lumen. IMA: Patent, arising from the false lumen. Inflow: Dissection flap extends into the right common iliac artery just above the bifurcation. Patent. Bilaterally  Veins: Grossly unremarkable. Review of the MIP images confirms the above findings. NON-VASCULAR Hepatobiliary: 13 mm right hepatic cyst (series 6/image 197), benign. Gallbladder is unremarkable. No intrahepatic or extrahepatic ductal dilatation. Pancreas: Within normal limits. Spleen: Within normal limits. Adrenals/Urinary Tract: Adrenal glands are within normal limits. Kidneys are within normal limits. No hydronephrosis. Bladder is mildly thick-walled although underdistended. Stomach/Bowel: Stomach is within normal limits. No evidence of bowel obstruction. Normal appendix (series 6/image 260). No colonic wall thickening or inflammatory changes. Lymphatic: No suspicious abdominopelvic lymphadenopathy. Reproductive: Prostate is unremarkable. Other: No abdominopelvic ascites. Musculoskeletal: Degenerative changes of the lumbar spine. Review of the MIP images confirms the above findings. 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 Stable chronic thoracoabdominal aortic dissection, with a thrombosed false lumen from the level of the distal graft to just above the aortic hiatus. Dissection flap remains visible through the distal right common iliac artery, unchanged. No evidence of pulmonary embolism to the lobar level. Otherwise negative. Electronically Signed   By: Charline Bills M.D.   On: 12/07/2022 21:05    Procedures Procedures    Medications Ordered in ED Medications  lactated ringers bolus 1,000 mL (0 mLs Intravenous Stopped 12/07/22 2300)  iohexol (OMNIPAQUE) 350 MG/ML injection 100 mL (100  mLs Intravenous Contrast Given 12/07/22 1936)    ED Course/ Medical Decision Making/ A&P                                 Medical Decision Making Amount and/or Complexity of Data Reviewed Labs: ordered. Radiology: ordered.  Risk Prescription drug management.   Patient is a 72 year old male with a history of hypertension and aortic dissection presenting for  lightheadedness.  On my initial evaluation, he is afebrile, hemodynamically stable, in no acute distress.  He reports increasing lightheadedness over the past 2 days that was initially with heavy exertion but today has been with lighter exercise.  On my exam, he does have 2+ pulses in all 4 distal extremities however heart rate is irregular and he does have a systolic murmur.  Given patient's history, will obtain CT scan to evaluate for aortic pathology.  Additionally will send troponin to evaluate for potential cardiac ischemia.  Will obtain further labs to evaluate for electrolyte or metabolic abnormality, infection.  Additionally will give 1 L fluid bolus to treat possible dehydration.  Results reviewed.  Personally reviewed EKG which demonstrates frequent PVCs but no ST elevation or depression, no evidence of AV block.  CBC without leukocytosis, mild anemia present with hemoglobin 12.6.  BMP with overall normal electrolytes, no AKI or anion gap.  Initial and repeat troponin are 5.  CTA is without evidence of acute change to known aortic dissection.  Patient reassessed following fluid bolus and reports that he is overall feeling well.  He has been able to ambulate to and from the bathroom several times over ED course.  On my review of telemetry, frequency of PVCs is decreased.  All results discussed with patient.  Strongly recommend close outpatient follow-up with cardiologist and patient is agreeable to this.  Return precautions including new or worsening chest pain or shortness of breath or change mental status.  Patient reports understanding and agreement.  Discharged no further acute about care in the emergency department.        Final Clinical Impression(s) / ED Diagnoses Final diagnoses:  Near syncope    Rx / DC Orders ED Discharge Orders     None         Claretha Cooper, DO 12/08/22 1358    Lorre Nick, MD 12/08/22 [phone]

## 2022-12-07 NOTE — ED Provider Notes (Signed)
I saw and evaluated the patient, reviewed the resident's note and I agree with the findings and plan.  EKG Interpretation Date/Time:  Thursday December 07 2022 17:51:11 EDT Ventricular Rate:  59 PR Interval:  170 QRS Duration:  98 QT Interval:  411 QTC Calculation: 408 R Axis:   -30  Text Interpretation: Sinus rhythm Ventricular trigeminy Probable left atrial enlargement Left axis deviation Confirmed by Lorre Nick ([id number]) on 12/07/2022 6:27:47 PM   Patient is EKG per interpretation shows normal sinus rhythm with ventricular ectopy.  Patient here with episodes of dizziness which been going on for several days.  No other associated symptoms.  Electrolytes and troponin magnesium all within normal limits.  He does have a lot of PVCs on his rhythm strip.  He is asymptomatic with this and denies any syncopal events today or near syncopal event.  Plan will be to scan patient's chest and reassessed   Lorre Nick, MD 12/07/22 1913

## 2022-12-08 ENCOUNTER — Encounter: Payer: Self-pay | Admitting: Internal Medicine

## 2022-12-08 DIAGNOSIS — R55 Syncope and collapse: Secondary | ICD-10-CM

## 2022-12-09 ENCOUNTER — Encounter: Payer: Self-pay | Admitting: Internal Medicine

## 2022-12-11 NOTE — Telephone Encounter (Addendum)
Orbie Pyo, MD  Lendon Ka, RN Let's place a 5 day monitor and TTE in preparation for appt with Scott later this month. _______________________________________________________  Orders placed for echo.  Reviewed with Tereso Newcomer, PA-C who recommends placing a live Zio in office in order to capture the most information possible before his hospital follow up appointment on 12/19/22.

## 2022-12-12 ENCOUNTER — Ambulatory Visit: Payer: Medicare Other | Attending: Internal Medicine

## 2022-12-12 DIAGNOSIS — R55 Syncope and collapse: Secondary | ICD-10-CM

## 2022-12-12 NOTE — Progress Notes (Unsigned)
ZIO AT serial # [id number] from office inventory applied to patient for pre-op clearance.  Patient has EP appointment 12/19/22.  Dr. Lynnette Caffey to read.

## 2022-12-14 ENCOUNTER — Encounter: Payer: Self-pay | Admitting: Thoracic Surgery (Cardiothoracic Vascular Surgery)

## 2022-12-15 ENCOUNTER — Ambulatory Visit (HOSPITAL_COMMUNITY)
Admission: RE | Admit: 2022-12-15 | Discharge: 2022-12-15 | Disposition: A | Discharge status: Home / Self Care | Payer: Medicare Other | Source: Ambulatory Visit | Attending: Internal Medicine

## 2022-12-15 DIAGNOSIS — I10 Essential (primary) hypertension: Secondary | ICD-10-CM | POA: Diagnosis not present

## 2022-12-15 DIAGNOSIS — R55 Syncope and collapse: Secondary | ICD-10-CM | POA: Diagnosis present

## 2022-12-15 LAB — ECHOCARDIOGRAM COMPLETE
AR max vel: 2.19 cm2
AV Area VTI: 2.59 cm2
AV Area mean vel: 2.19 cm2
AV Mean grad: 4 mmHg
AV Peak grad: 8.9 mmHg
Ao pk vel: 1.49 m/s
Area-P 1/2: 2.86 cm2
Calc EF: 53.9 %
MV VTI: 2.79 cm2
S' Lateral: 3.7 cm
Single Plane A2C EF: 52.5 %
Single Plane A4C EF: 52.5 %

## 2022-12-18 NOTE — Progress Notes (Unsigned)
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 ([place]) Postop A-fib TTE 03/18/2021: EF 50-55, no RWMA, moderate LVH, normal RVSF, trivial MR, normal structure and function of AVR with mean gradient 5, RAP 3 CT 12/07/2022: S/p AVR and ascending arch graft; stable chronic thoracoabdominal aortic dissection with thrombosed false lumen from level of distal graft to just above aortic hiatus TTE 12/15/2022: Technically difficult, frequent ectopy, EF 55-60, no RWMA, trivial MR, RAP 3, mean AV 4 Coronary Ca2+ on CT Hypertension Hyperlipidemia Monitor 09/2021: NSR, no A-fib, ventricular arrhythmias or bradycardia arrhythmias, PVCs/PACs        History of Present Illness:  Discussed the use of AI scribe software for clinical note transcription with the patient, who gave verbal consent to proceed.   Daniel Robinson is a 72 y.o. male who returns for posthospitalization follow-up.  He was last seen by Dr. Lynnette Robinson in February 2024.  He recently went to the emergency room 12/07/2022 for lightheadedness/near syncope.  CT demonstrated stable ascending arch graft repair, chronic dissection.  He had frequent PVCs noted on EKG.  Troponin was normal at 5.  He felt better after fluid bolus.  Follow-up echocardiogram was arranged and demonstrated normal EF, mean AV gradient 4.  Follow-up event monitor is currently pending.   He is here alone. He reports that his PVC symptoms are particularly severe in the afternoon, often leading to a need for a nap. The patient also experiences occasional chest tightness, particularly when exerting himself, such as when climbing stairs. However, this tightness does not cause him to stop his  activities. It is intermittent and does not occur every time he exerts himself. The patient also reports a constant tightness in his neck and shoulders, worse with the PVCs. He has not had syncope, orthopnea, leg edema.    ROS:  See HPI No fevers, cough, melena, hematochezia, hematuria.    Studies Reviewed:       Risk Assessment/Calculations:             Physical Exam:   VS:  BP 100/60   Pulse (!) 58   Ht 5\' 10"  (1.778 m)   Wt 189 lb 9.6 oz (86 kg)   SpO2 98%   BMI 27.20 kg/m    Wt Readings from Last 3 Encounters:  12/19/22 189 lb 9.6 oz (86 kg)  11/14/22 195 lb (88.5 kg)  06/28/22 193 lb (87.5 kg)    Constitutional:      Appearance: Healthy appearance. Not in distress.  Neck:     Vascular: JVD normal.  Pulmonary:     Breath sounds: Normal breath sounds. No wheezing. No rales.  Cardiovascular:     Normal rate. Regular rhythm.     Murmurs: There is a grade 1/6 systolic murmur at the URSB.  Edema:    Peripheral edema absent.  Abdominal:     Palpations: Abdomen is soft.        Assessment and Plan:     Frequent PVCs Final report is pending from his ZIO AT monitor.  I suspect his burden will be high.  I did review this today with Dr. Anne Robinson (attending MD).  He appears to  have mostly bigeminy and trigeminy.  There was 1 episode called nonsustained VT.  However this appears to be aberrantly conducted beats along with PVCs and not ventricular tachycardia.  His EF is 55-60% on recent echo.  He is very symptomatic with PVCs.  He has not had syncope.  His blood pressure has been running low.  There were times that his monitor did demonstrate sinus bradycardia with heart rates in the 40s.  I am hesitant to increase his beta-blocker further. -Refer to electrophysiology for further evaluation and management of PVCs -Myocardial PET to rule out ischemia -Check TSH -Continue carvedilol 6.2 mg twice daily  Hypertension Blood pressure on the lower side, possibly making his  lightheadedness worse. -Reduce Olmesartan to 10 mg daily -Continue carvedilol 6.25 mg twice daily -Monitor blood pressure -Increase olmesartan back to 20 mg if systolic pressure greater than 140  Aortic Dissection (status post repair) Stable on recent CT. -Continue current management.  Chest pain He notes occasional chest tightness.  This sometimes occurs with exertion.  Recent CT did demonstrate coronary calcification of the LAD. -Obtain myocardial PET stress test to rule out ischemia        Informed Consent   Shared Decision Making/Informed Consent The risks [chest pain, shortness of breath, cardiac arrhythmias, dizziness, blood pressure fluctuations, myocardial infarction, stroke/transient ischemic attack, nausea, vomiting, allergic reaction, radiation exposure, metallic taste sensation and life-threatening complications (estimated to be 1 in 10,000)], benefits (risk stratification, diagnosing coronary artery disease, treatment guidance) and alternatives of a cardiac PET stress test were discussed in detail with Daniel Robinson and he agrees to proceed.     Dispo:  Return for Evaluation with EP for PVCs.  Signed, Tereso Newcomer, PA-C

## 2022-12-19 ENCOUNTER — Encounter: Payer: Self-pay | Admitting: Physician Assistant

## 2022-12-19 ENCOUNTER — Ambulatory Visit: Payer: Medicare Other | Attending: Physician Assistant | Admitting: Physician Assistant

## 2022-12-19 VITALS — BP 100/60 | HR 58 | Ht 70.0 in | Wt 189.6 lb

## 2022-12-19 DIAGNOSIS — I493 Ventricular premature depolarization: Secondary | ICD-10-CM | POA: Diagnosis not present

## 2022-12-19 DIAGNOSIS — R072 Precordial pain: Secondary | ICD-10-CM | POA: Insufficient documentation

## 2022-12-19 DIAGNOSIS — E782 Mixed hyperlipidemia: Secondary | ICD-10-CM

## 2022-12-19 DIAGNOSIS — R55 Syncope and collapse: Secondary | ICD-10-CM | POA: Insufficient documentation

## 2022-12-19 DIAGNOSIS — I10 Essential (primary) hypertension: Secondary | ICD-10-CM | POA: Insufficient documentation

## 2022-12-19 DIAGNOSIS — I7103 Dissection of thoracoabdominal aorta: Secondary | ICD-10-CM | POA: Diagnosis present

## 2022-12-19 MED ORDER — OLMESARTAN MEDOXOMIL 20 MG PO TABS: 10.0000 mg | ORAL_TABLET | Freq: Every day | ORAL | Status: DC

## 2022-12-19 NOTE — Patient Instructions (Addendum)
Medication Instructions:  Your physician has recommended you make the following change in your medication: 1.  REDUCE the Olmesartan to 10 mg taking 1 daily... if your blood pressure is higher than 140 on the top 3 days in a row, then increase it back to 20 mg daily  *If you need a refill on your cardiac medications before your next appointment, please call your pharmacy*   Lab Work: TODAY:  TSH  If you have labs (blood work) drawn today and your tests are completely normal, you will receive your results only by: MyChart Message (if you have MyChart) OR A paper copy in the mail If you have any lab test that is abnormal or we need to change your treatment, we will call you to review the results.   Testing/Procedures: Your physician recommends you have a Cardiac PET Scan.  Once we get authorization from your insurance company, someone will reach out to you to schedule.  How to Prepare for Your Cardiac PET/CT Stress Test:  1. Please do not take these medications before your test:   HOLD THE CARVEDILOL THE MORNING OF  Medications that may interfere with the cardiac pharmacological stress agent (ex. nitrates - including erectile dysfunction medications, isosorbide mononitrate, tamulosin or beta-blockers) the day of the exam. (Erectile dysfunction medication should be held for at least 72 hrs prior to test) Theophylline containing medications for 12 hours. Dipyridamole 48 hours prior to the test. Your remaining medications may be taken with water.  2. Nothing to eat or drink, except water, 3 hours prior to arrival time.   NO caffeine/decaffeinated products, or chocolate 12 hours prior to arrival.  3. NO perfume, cologne or lotion on chest or abdomen area.          - FEMALES - Please avoid wearing dresses to this appointment.  4. Total time is 1 to 2 hours; you may want to bring reading material for the waiting time.  5. Please report to Radiology at the [facility] Main  Entrance 30 minutes early for your test.  [address] [place], [place] [address]  6. Please report to Radiology at [facility] Main Entrance, medical mall, 30 mins prior to your test.  [address]  [place], [place]  [phone]    IF YOU THINK YOU MAY BE PREGNANT, OR ARE NURSING PLEASE INFORM THE TECHNOLOGIST.  In preparation for your appointment, medication and supplies will be purchased.  Appointment availability is limited, so if you need to cancel or reschedule, please call the Radiology Department at [phone] [facility]) OR [phone] [facility])  24 hours in advance to avoid a cancellation fee of $100.00  What to Expect After you Arrive:  Once you arrive and check in for your appointment, you will be taken to a preparation room within the Radiology Department.  A technologist or Nurse will obtain your medical history, verify that you are correctly prepped for the exam, and explain the procedure.  Afterwards,  an IV will be started in your arm and electrodes will be placed on your skin for EKG monitoring during the stress portion of the exam. Then you will be escorted to the PET/CT scanner.  There, staff will get you positioned on the scanner and obtain a blood pressure and EKG.  During the exam, you will continue to be connected to the EKG and blood pressure machines.  A small, safe amount of a radioactive tracer will be injected in your IV to obtain  a series of pictures of your heart along with an injection of a stress agent.    After your Exam:  It is recommended that you eat a meal and drink a caffeinated beverage to counter act any effects of the stress agent.  Drink plenty of fluids for the remainder of the day and urinate frequently for the first couple of hours after the exam.  Your doctor will inform you of your test results within 7-10 business days.  For more information and frequently asked questions, please visit our website :  [email or web address]  For questions about your test or how to prepare for your test, please call: Cardiac Imaging Nurse Navigators Office: [phone]     You have been referred to EP ASAP  Follow-Up: At [facility], you and your health needs are our priority.  As part of our continuing mission to provide you with exceptional heart care, we have created designated Provider Care Teams.  These Care Teams include your primary Cardiologist (physician) and Advanced Practice Providers (APPs -  Physician Assistants and Nurse Practitioners) who all work together to provide you with the care you need, when you need it.  We recommend signing up for the patient portal called "MyChart".  Sign up information is provided on this After Visit Summary.  MyChart is used to connect with patients for Virtual Visits (Telemedicine).  Patients are able to view lab/test results, encounter notes, upcoming appointments, etc.  Non-urgent messages can be sent to your provider as well.   To learn more about what you can do with MyChart, go to [email or web address].    Your next appointment:   ASAP  Provider:   Loman Brooklyn, MD, Sherryl Manges, MD, Steffanie Dunn, MD, York Pellant, MD, or Lewayne Bunting, MD    Other Instructions

## 2022-12-20 ENCOUNTER — Ambulatory Visit: Payer: Medicare Other | Attending: Cardiology | Admitting: Cardiology

## 2022-12-20 ENCOUNTER — Encounter: Payer: Self-pay | Admitting: Cardiology

## 2022-12-20 VITALS — BP 116/64 | HR 58 | Ht 70.0 in | Wt 189.6 lb

## 2022-12-20 DIAGNOSIS — I10 Essential (primary) hypertension: Secondary | ICD-10-CM | POA: Diagnosis present

## 2022-12-20 DIAGNOSIS — I493 Ventricular premature depolarization: Secondary | ICD-10-CM | POA: Insufficient documentation

## 2022-12-20 LAB — TSH: TSH: 1.91 u[IU]/mL (ref 0.450–4.500)

## 2022-12-20 MED ORDER — MEXILETINE HCL 250 MG PO CAPS: 250.0000 mg | ORAL_CAPSULE | Freq: Two times a day (BID) | ORAL | 3 refills | Status: DC

## 2022-12-20 NOTE — Progress Notes (Signed)
Electrophysiology Office Note:   Date:  12/20/2022  ID:  Daniel Robinson, DOB Apr 05, 1950, MRN [id number]  Primary Cardiologist: Orbie Pyo, MD Electrophysiologist: None      History of Present Illness:   Daniel Robinson is a 72 y.o. male with h/o PVCs, hypertension, type a aortic dissection post repair seen today for  for Electrophysiology evaluation of PVCs at the request of Tereso Newcomer.    He presented to the hospital 12/07/2022 with lightheadedness and near syncope.  CT showed a stable ascending aortic arch repair.  He was noted to have PVCs on his EKG.  When he has PVCs, he feels that he needs to take a nap.  He has occasional chest tightness when exerting himself.  This tightness does not cause him to change his activities.  Times that he feels well.  Usually feels better in the mornings compared to the afternoons.  In the afternoons, he feels quite fatigued and weak that he attributes to his arrhythmia.  He can feel his PVCs.  Review of systems complete and found to be negative unless listed in HPI.   EP Information / Studies Reviewed:    EKG is not ordered today. EKG from 12/20/22 reviewed which showed this rhythm, PVCs       Risk Assessment/Calculations:              Physical Exam:   VS:  There were no vitals taken for this visit.   Wt Readings from Last 3 Encounters:  12/19/22 189 lb 9.6 oz (86 kg)  11/14/22 195 lb (88.5 kg)  06/28/22 193 lb (87.5 kg)     GEN: Well nourished, well developed in no acute distress NECK: No JVD; No carotid bruits CARDIAC: Regular rate and rhythm with occasional ectopy, no murmurs, rubs, gallops RESPIRATORY:  Clear to auscultation without rales, wheezing or rhonchi  ABDOMEN: Soft, non-tender, non-distended EXTREMITIES:  No edema; No deformity   ASSESSMENT AND PLAN:    1.  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  He has had an aortic root replacement with aortic valve replacement 2 years ago.  Due to that, he would be a poor  ablation candidate despite the outflow tract nature of his PVCs as they could be coming from around the coronary cusps.  Anatasia Tino hold off for now on ablation and start mexiletine to 50 mg twice daily.  If he does need ablation, he may need to be referred to another center.  2.  Hypertension: Currently well-controlled  3.  Aortic dissection: Status postrepair.  Plan per primary cardiology  Follow up with EP APP  6 weeks   Signed, Challen Spainhour Jorja Loa, MD

## 2022-12-20 NOTE — Patient Instructions (Signed)
Medication Instructions:  Your physician has recommended you make the following change in your medication:  START Mexiletine 250 mg twice daily  *If you need a refill on your cardiac medications before your next appointment, please call your pharmacy*   Lab Work: None ordered If you have labs (blood work) drawn today and your tests are completely normal, you will receive your results only by: MyChart Message (if you have MyChart) OR A paper copy in the mail If you have any lab test that is abnormal or we need to change your treatment, we will call you to review the results.   Testing/Procedures: None ordered   Follow-Up: At [facility] you and your health needs are our priority.  As part of our continuing mission to provide you with exceptional heart care, we have created designated Provider Care Teams.  These Care Teams include your primary Cardiologist (physician) and Advanced Practice Providers (APPs -  Physician Assistants and Nurse Practitioners) who all work together to provide you with the care you need, when you need it.  We recommend signing up for the patient portal called "MyChart".  Sign up information is provided on this After Visit Summary.  MyChart is used to connect with patients for Virtual Visits (Telemedicine).  Patients are able to view lab/test results, encounter notes, upcoming appointments, etc.  Non-urgent messages can be sent to your provider as well.   To learn more about what you can do with MyChart, go to [email or web address].    Your next appointment:   6 week(s)  The format for your next appointment:   In Person  Provider:   You will see one of the following Advanced Practice Providers on your designated Care Team:   [name], [place] "Mardelle Matte" [place], [place] [name], NP    Thank you for choosing [facility]!!   Dory Horn, RN [phone]  Other Instructions  Mexiletine Capsules What is this medication? MEXILETINE  (mex IL e teen) treats a fast or irregular heartbeat (arrhythmia). It works by slowing down overactive electric signals in the heart, which stabilizes your heart rhythm. It belongs to a group of medications called antiarrhythmics. This medicine may be used for other purposes; ask your health care provider or pharmacist if you have questions. COMMON BRAND NAME(S): Mexitil What should I tell my care team before I take this medication? They need to know if you have any of these conditions: Liver disease Other heart problems Previous heart attack An unusual or allergic reaction to mexiletine, other medications, foods, dyes, or preservatives Pregnant or trying to get pregnant Breast-feeding How should I use this medication? Take this medication by mouth with a glass of water. Follow the directions on the prescription label. It is recommended that you take this medication with food or an antacid. Take your doses at regular intervals. Do not take your medication more often than directed. Do not stop taking except on the advice of your care team. Talk to your care team about the use of this medication in children. Special care may be needed. Overdosage: If you think you have taken too much of this medicine contact a poison control center or emergency room at once. NOTE: This medicine is only for you. Do not share this medicine with others. What if I miss a dose? If you miss a dose, take it as soon as you can. If it is almost time for your next dose, take only that dose. Do not take double or extra doses. What  may interact with this medication? Do not take this medication with any of the following: Dofetilide This medication may also interact with the following: Caffeine Cimetidine Medications for depression, anxiety, or psychotic disturbances Medications to control heart rhythm Phenobarbital Phenytoin Rifampin Theophylline This list may not describe all possible interactions. Give your health  care provider a list of all the medicines, herbs, non-prescription drugs, or dietary supplements you use. Also tell them if you smoke, drink alcohol, or use illegal drugs. Some items may interact with your medicine. What should I watch for while using this medication? Your condition will be monitored closely when you first begin therapy. Often, this medication is first started in a hospital or other monitored health care setting. Once you are on maintenance therapy, visit your care team for regular checks on your progress. Because your condition and use of this medication carry some risk, it is a good idea to carry an identification card, necklace or bracelet with details of your condition, medications, and care team. You may get drowsy or dizzy. Do not drive, use machinery, or do anything that needs mental alertness until you know how this medication affects you. Do not stand or sit up quickly, especially if you are an older patient. This reduces the risk of dizzy or fainting spells. Alcohol can make you more dizzy, increase flushing and rapid heartbeats. Avoid alcoholic drinks. This medication may cause serious skin reactions. They can happen weeks to months after starting the medication. Contact your care team right away if you notice fevers or flu-like symptoms with a rash. The rash may be red or purple and then turn into blisters or peeling of the skin. Or, you might notice a red rash with swelling of the face, lips or lymph nodes in your neck or under your arms. What side effects may I notice from receiving this medication? Side effects that you should report to your care team as soon as possible: Allergic reactions--skin rash, itching, hives, swelling of the face, lips, tongue, or throat Heart rhythm changes--fast or irregular heartbeat, dizziness, feeling faint or lightheaded, chest pain, trouble breathing Infection--fever, chills, cough, or sore throat Liver injury--right upper belly pain, loss of  appetite, nausea, light-colored stool, dark yellow or brown urine, yellowing skin or eyes, unusual weakness or fatigue Rash, fever, and swollen lymph nodes Seizures Unusual bruising or bleeding Side effects that usually do not require medical attention (report to your care team if they continue or are bothersome): Anxiety, nervousness Blurry vision Dizziness Headache Heartburn Nausea Tremors or shaking Vomiting This list may not describe all possible side effects. Call your doctor for medical advice about side effects. You may report side effects to FDA at 1-800-FDA-1088. Where should I keep my medication? Keep out of reach of children. Store at room temperature between 15 and 30 degrees C (59 and 86 degrees F). Throw away any unused medication after the expiration date. NOTE: This sheet is a summary. It may not cover all possible information. If you have questions about this medicine, talk to your doctor, pharmacist, or health care provider.  2024 Elsevier/Gold Standard (2021-01-21 00:00:00)

## 2022-12-26 ENCOUNTER — Encounter: Payer: Self-pay | Admitting: Thoracic Surgery (Cardiothoracic Vascular Surgery)

## 2022-12-26 ENCOUNTER — Ambulatory Visit (INDEPENDENT_AMBULATORY_CARE_PROVIDER_SITE_OTHER): Payer: Medicare Other | Admitting: Thoracic Surgery (Cardiothoracic Vascular Surgery)

## 2022-12-26 ENCOUNTER — Other Ambulatory Visit: Payer: Medicare Other

## 2022-12-26 VITALS — BP 138/73 | HR 59 | Resp 20 | Ht 70.0 in | Wt 189.6 lb

## 2022-12-26 DIAGNOSIS — I7103 Dissection of thoracoabdominal aorta: Secondary | ICD-10-CM

## 2022-12-26 NOTE — Progress Notes (Signed)
[address]       [place] [address]             [phone]     HPI: Mr. Dunlow returns for follow-up of his aortic dissection.  Mansour Balboa is a 72 year old male with a history of hypertension, hyperlipidemia, and a type I aortic dissection.  He had the dissection while he was out in [place] hunting.  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   Last saw him in the office a year ago.  He was doing well at that time.  He recently was in the emergency room with near syncope and dizziness.  Turned out he was having very frequent PVCs.  He recently saw Dr. Elberta Fortis.  Was started on mexiletine.  His symptoms have improved since starting that medication.  Past Medical History:  Diagnosis Date   Aortic dissection, thoracoabdominal (HCC) 01/2021   Hyperlipidemia    Hypertension    Routine general medical examination at a health care facility 11/14/2022    Current Outpatient Medications  Medication Sig Dispense Refill   aspirin 81 MG EC tablet Take 81 mg by mouth every evening. Swallow whole.     carvedilol (COREG) 6.25 MG tablet TAKE 1 TABLET BY MOUTH TWICE A DAY 180 tablet 2   Evolocumab (REPATHA SURECLICK) 140 MG/ML SOAJ Inject 140 mg into the skin every 14 (fourteen) days. 6 mL 3   mexiletine (MEXITIL) 250 MG capsule Take 1 capsule (250 mg total) by mouth 2 (two) times daily. 60 capsule 3   olmesartan (BENICAR) 20 MG tablet Take 0.5 tablets (10 mg total) by mouth daily.     No current facility-administered medications for this visit.    Physical Exam BP 138/73 (BP Location: Left Arm, Patient Position: Sitting, Cuff Size: Normal)   Pulse (!) 59   Resp 20   Ht 5\' 10"  (1.778 m)   Wt 189 lb 9.6 oz (86 kg)   SpO2 95% Comment: RA  BMI 27.78 kg/m  72 year old man in no acute distress Alert and oriented x 3 with no focal deficits Lungs clear with equal breath sounds bilaterally Cardiac regular  rate and rhythm with a 2/6 systolic murmur Sternal incision well-healed No peripheral edema  Diagnostic Tests: CT ANGIOGRAPHY CHEST, ABDOMEN AND PELVIS   TECHNIQUE: Non-contrast CT of the chest was initially obtained.   Multidetector CT imaging through the chest, abdomen and pelvis was performed using the standard protocol during bolus administration of intravenous contrast. Multiplanar reconstructed images and MIPs were obtained and reviewed to evaluate the vascular anatomy.   RADIATION DOSE REDUCTION: This exam was performed according to the departmental dose-optimization program which includes automated exposure control, adjustment of the mA and/or kV according to patient size and/or use of iterative reconstruction technique.   CONTRAST:  OMNIPAQUE IOHEXOL 350 MG/ML SOLN   COMPARISON:  12/20/2021   FINDINGS: CTA CHEST FINDINGS   Cardiovascular: Status post aortic valve replacement ascending arch graft. On unenhanced CT, there is no evidence of intramural hematoma.   Following contrast administration, the prior dissection just distal to the graft is again not visualized, with a suspected thrombosed false lumen. However, in the descending thoracic aorta just above the aortic hiatus the dissection flap remains visible (series 6/image 183). This appearance is unchanged from the prior.   Although not tailored for evaluation of the pulmonary arteries, there is no evidence of pulmonary embolism to  the lobar level.   The heart is normal in size.  No pericardial effusion.   Moderate coronary atherosclerosis of the LAD and right coronary artery.   Mediastinum/Nodes: No suspicious mediastinal lymphadenopathy.   Visualized thyroid is unremarkable.   Lungs/Pleura: Mild biapical pleural-parenchymal scarring.   No suspicious pulmonary nodules. Stable 5 x 3 mm triangular subpleural nodule in the anterior right lower lobe along the major fissure (series 7/image 119),  benign.   No focal consolidation.   No pleural effusion or pneumothorax.   Musculoskeletal: Thoracic spine is within normal limits. Median sternotomy.   Review of the MIP images confirms the above findings.   CTA ABDOMEN AND PELVIS FINDINGS   VASCULAR   Aorta: Patent.  Chronic dissection.  Atherosclerotic calcifications.   Celiac: Patent, arising predominantly from the true lumen.   SMA: Patent, arising from the true lumen.   Renals: Patent bilaterally, both arising from the true lumen.   IMA: Patent, arising from the false lumen.   Inflow: Dissection flap extends into the right common iliac artery just above the bifurcation. Patent. Bilaterally   Veins: Grossly unremarkable.   Review of the MIP images confirms the above findings.   NON-VASCULAR   Hepatobiliary: 13 mm right hepatic cyst (series 6/image 197), benign.   Gallbladder is unremarkable. No intrahepatic or extrahepatic ductal dilatation.   Pancreas: Within normal limits.   Spleen: Within normal limits.   Adrenals/Urinary Tract: Adrenal glands are within normal limits.   Kidneys are within normal limits. No hydronephrosis.   Bladder is mildly thick-walled although underdistended.   Stomach/Bowel: Stomach is within normal limits.   No evidence of bowel obstruction.   Normal appendix (series 6/image 260).   No colonic wall thickening or inflammatory changes.   Lymphatic: No suspicious abdominopelvic lymphadenopathy.   Reproductive: Prostate is unremarkable.   Other: No abdominopelvic ascites.   Musculoskeletal: Degenerative changes of the lumbar spine.   Review of the MIP images confirms the above findings.   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   Stable chronic thoracoabdominal aortic dissection, with a thrombosed false lumen from the level of the distal graft to just above the aortic hiatus. Dissection flap remains visible through the distal right  common iliac artery, unchanged.   No evidence of pulmonary embolism to the lobar level.   Otherwise negative.     Electronically Signed   By: Charline Bills M.D.   On: 12/07/2022 21:05 I personally reviewed the CT images.  Status post repair of a type I aortic dissection.  Thrombosis of the false lumen in the thoracic aorta, but persistent patency of the false lumen from just above the diaphragm to the right iliac.  No change from a year ago.  Impression: Jeffie Spivack is a 72 year old male with a history of hypertension, hyperlipidemia, and a type I aortic dissection.  He had the dissection while he was out in [place] hunting.  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   Type I aortic dissection-status post hemiarch repair using a bovine pericardial valve conduit.  No issues related to repair.  Does have residual patency of the false lumen from about the level of the diaphragm to the iliac.  No aneurysmal dilatation at present, but needs continued follow-up as he is at risk for aneurysmal development.  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  Symptoms better controlled with mexiletine.  Hypertension-pressure a little higher than ideal at  138 systolic.  Would prefer less than 130.  Recent changes to his medications due to symptomatic PVCs and near syncope.  Will need follow-up.  Plan: Return in 1 year with CT angiogram of chest  Loreli Slot, MD [facility] [phone]

## 2022-12-29 ENCOUNTER — Encounter (HOSPITAL_COMMUNITY): Payer: Self-pay

## 2022-12-29 ENCOUNTER — Telehealth (HOSPITAL_COMMUNITY): Payer: Self-pay | Admitting: Emergency Medicine

## 2022-12-29 NOTE — Telephone Encounter (Signed)
Reaching out to patient to offer assistance regarding upcoming cardiac imaging study; pt verbalizes understanding of appt date/time, parking situation and where to check in, pre-test NPO status and medications ordered, and verified current allergies; name and call back number provided for further questions should they arise Cayne Yom RN Navigator Cardiac Imaging [facility] [phone] office [phone] cell 

## 2023-01-02 ENCOUNTER — Ambulatory Visit (HOSPITAL_COMMUNITY)
Admission: RE | Admit: 2023-01-02 | Discharge: 2023-01-02 | Disposition: A | Discharge status: Home / Self Care | Payer: Medicare Other | Source: Ambulatory Visit | Attending: Physician Assistant | Admitting: Physician Assistant

## 2023-01-02 ENCOUNTER — Encounter: Payer: Self-pay | Admitting: Physician Assistant

## 2023-01-02 DIAGNOSIS — I493 Ventricular premature depolarization: Secondary | ICD-10-CM | POA: Insufficient documentation

## 2023-01-02 DIAGNOSIS — I10 Essential (primary) hypertension: Secondary | ICD-10-CM | POA: Insufficient documentation

## 2023-01-02 DIAGNOSIS — R072 Precordial pain: Secondary | ICD-10-CM | POA: Diagnosis present

## 2023-01-02 DIAGNOSIS — I2510 Atherosclerotic heart disease of native coronary artery without angina pectoris: Secondary | ICD-10-CM | POA: Insufficient documentation

## 2023-01-02 DIAGNOSIS — I7103 Dissection of thoracoabdominal aorta: Secondary | ICD-10-CM | POA: Diagnosis present

## 2023-01-02 DIAGNOSIS — R55 Syncope and collapse: Secondary | ICD-10-CM | POA: Diagnosis present

## 2023-01-02 HISTORY — DX: Atherosclerotic heart disease of native coronary artery without angina pectoris: I25.10

## 2023-01-02 LAB — NM PET CT CARDIAC PERFUSION MULTI W/ABSOLUTE BLOODFLOW
LV dias vol: 79 mL (ref 62–150)
LV sys vol: 36 mL
MBFR: 3.06
Nuc Rest EF: 54 %
Nuc Stress EF: 61 %
Rest MBF: 0.48 ml/g/min
Rest Nuclear Isotope Dose: 22 mCi
ST Depression (mm): 0 mm
Stress MBF: 1.47 ml/g/min
Stress Nuclear Isotope Dose: 22.3 mCi

## 2023-01-02 MED ORDER — REGADENOSON 0.4 MG/5ML IV SOLN
0.4000 mg | Freq: Once | INTRAVENOUS | Status: AC
  Administered 2023-01-02: 0.4 mg via INTRAVENOUS

## 2023-01-02 MED ORDER — RUBIDIUM RB82 GENERATOR (RUBYFILL)
20.9100 | PACK | Freq: Once | INTRAVENOUS | Status: AC
  Administered 2023-01-02: 22.03 via INTRAVENOUS

## 2023-01-02 MED ORDER — REGADENOSON 0.4 MG/5ML IV SOLN
INTRAVENOUS | Status: AC
  Filled 2023-01-02: qty 5

## 2023-01-02 MED ORDER — RUBIDIUM RB82 GENERATOR (RUBYFILL)
22.3000 | PACK | Freq: Once | INTRAVENOUS | Status: AC
  Administered 2023-01-02: 22.3 via INTRAVENOUS

## 2023-01-04 ENCOUNTER — Telehealth: Payer: Self-pay

## 2023-01-04 ENCOUNTER — Other Ambulatory Visit: Payer: Self-pay | Admitting: Medical Genetics

## 2023-01-04 DIAGNOSIS — Z006 Encounter for examination for normal comparison and control in clinical research program: Secondary | ICD-10-CM

## 2023-01-04 NOTE — Telephone Encounter (Signed)
Transition Care Management Follow-up Telephone Call Date of discharge and from where: 12/06/2022 [facility] How have you been since you were released from the hospital? Patient stated he is feeling better. Any questions or concerns? No  Items Reviewed: Did the pt receive and understand the discharge instructions provided? Yes  Medications obtained and verified?  No medication prescribed. Other? No  Any new allergies since your discharge? No  Dietary orders reviewed? Yes Do you have support at home? Yes   Follow up appointments reviewed:  PCP Hospital f/u appt confirmed? No  Scheduled to see  on  @ . Specialist Hospital f/u appt confirmed? Yes  Scheduled to see Will Jorja Loa, MD on 12/20/2022 @ [facility]. Are transportation arrangements needed? No  If their condition worsens, is the pt aware to call PCP or go to the Emergency Dept.? Yes Was the patient provided with contact information for the PCP's office or ED? Yes Was to pt encouraged to call back with questions or concerns? Yes   Rosamund Nyland [facility]  [facility], [facility] Guide Direct Dial: [phone]  Website: [email or web address]

## 2023-01-12 ENCOUNTER — Other Ambulatory Visit: Payer: Self-pay | Admitting: Cardiology

## 2023-01-14 NOTE — Progress Notes (Unsigned)
Electrophysiology Office Note:   Date:  01/16/2023  ID:  Daniel Robinson, DOB [birth year], MRN [id number]  Primary Cardiologist: Orbie Pyo, MD Electrophysiologist: None      History of Present Illness:   Daniel Robinson is a 72 y.o. male with h/o PVCs, hypertension, type a or aortic dissection post repair seen today for routine electrophysiology followup.   Since last being seen in our clinic the patient reports doing overall well.  He feels that his PVCs have been better controlled.  He no longer has a sinking feeling in the afternoon, and.  he denies chest pain, palpitations, dyspnea, PND, orthopnea, nausea, vomiting, dizziness, syncope, edema, weight gain, or early satiety.   Review of systems complete and found to be negative unless listed in HPI.   EP Information / Studies Reviewed:    EKG is ordered today. Personal review as below.  EKG Interpretation Date/Time:  Tuesday January 16 2023 13:43:00 EDT Ventricular Rate:  59 PR Interval:  152 QRS Duration:  100 QT Interval:  390 QTC Calculation: 386 R Axis:   73  Text Interpretation: Sinus bradycardia Indeterminate axis When compared with ECG of 16-Jan-2023 13:42, No significant change was found Confirmed by Daniel Robinson ([id number]) on 01/16/2023 1:46:49 PM     Risk Assessment/Calculations:              Physical Exam:   VS:  BP 124/80 (BP Location: Left Arm, Patient Position: Sitting, Cuff Size: Normal)   Ht 5\' 10"  (1.778 m)   Wt 186 lb 9.6 oz (84.6 kg)   SpO2 96%   BMI 26.77 kg/m    Wt Readings from Last 3 Encounters:  01/16/23 186 lb 9.6 oz (84.6 kg)  12/26/22 189 lb 9.6 oz (86 kg)  12/20/22 189 lb 9.6 oz (86 kg)     GEN: Well nourished, well developed in no acute distress NECK: No JVD; No carotid bruits CARDIAC: Regular rate and rhythm, no murmurs, rubs, gallops RESPIRATORY:  Clear to auscultation without rales, wheezing or rhonchi  ABDOMEN: Soft, non-tender, non-distended EXTREMITIES:  No edema; No  deformity   ASSESSMENT AND PLAN:    1.  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  Due to aortic root replacement and aortic valve replacement, would be a poor ablation candidate.  Currently on mexiletine.  Feeling much improved on mexiletine.  Continue current management.  Shonta Bourque plan for 2-week monitor prior to his next visit.  2.  Hypertension: Currently well-controlled  3.  Aortic dissection: Status postrepair.  Plan per primary cardiology.  Follow up with EP APP in 6 months  Signed, Daniel Hansen Jorja Loa, MD

## 2023-01-16 ENCOUNTER — Encounter: Payer: Self-pay | Admitting: Cardiology

## 2023-01-16 ENCOUNTER — Ambulatory Visit: Payer: Medicare Other | Attending: Cardiology | Admitting: Cardiology

## 2023-01-16 VITALS — BP 124/80 | Ht 70.0 in | Wt 186.6 lb

## 2023-01-16 DIAGNOSIS — I493 Ventricular premature depolarization: Secondary | ICD-10-CM | POA: Insufficient documentation

## 2023-01-16 DIAGNOSIS — R002 Palpitations: Secondary | ICD-10-CM | POA: Diagnosis present

## 2023-01-16 MED ORDER — MEXILETINE HCL 250 MG PO CAPS: 250.0000 mg | ORAL_CAPSULE | Freq: Two times a day (BID) | ORAL | 2 refills | Status: DC

## 2023-01-16 NOTE — Patient Instructions (Signed)
Medication Instructions:  Your physician recommends that you continue on your current medications as directed. Please refer to the Current Medication list given to you today.  *If you need a refill on your cardiac medications before your next appointment, please call your pharmacy*   Lab Work: None ordered   Testing/Procedures: ZIO XT- Long Term Monitor Instructions  Your physician has requested you wear a ZIO patch monitor for 14 days - you will wear this in 5 months.  This is a single patch monitor. [employer] supplies one patch monitor per enrollment. Additional stickers are not available. Please do not apply patch if you will be having a Nuclear Stress Test,  Echocardiogram, Cardiac CT, MRI, or Chest Xray during the period you would be wearing the  monitor. The patch cannot be worn during these tests. You cannot remove and re-apply the  ZIO XT patch monitor.  Your ZIO patch monitor will be mailed 3 day USPS to your address on file. It may take 3-5 days  to receive your monitor after you have been enrolled.  Once you have received your monitor, please review the enclosed instructions. Your monitor  has already been registered assigning a specific monitor serial # to you.  Billing and Patient Assistance Program Information  We have supplied [employer] with any of your insurance information on file for billing purposes. [employer] offers a sliding scale Patient Assistance Program for patients that do not have  insurance, or whose insurance does not completely cover the cost of the ZIO monitor.  You must apply for the Patient Assistance Program to qualify for this discounted rate.  To apply, please call [employer] at [phone], select option 4, select option 2, ask to apply for  Patient Assistance Program. [name] will ask your household income, and how many people  are in your household. They will quote your out-of-pocket cost based on that information.  [employer] will also be able to set up a  13-month, interest-free payment plan if needed.  Applying the monitor   Shave hair from upper left chest.  Hold abrader disc by orange tab. Rub abrader in 40 strokes over the upper left chest as  indicated in your monitor instructions.  Clean area with 4 enclosed alcohol pads. Let dry.  Apply patch as indicated in monitor instructions. Patch will be placed under collarbone on left  side of chest with arrow pointing upward.  Rub patch adhesive wings for 2 minutes. Remove white label marked "1". Remove the white  label marked "2". Rub patch adhesive wings for 2 additional minutes.  While looking in a mirror, press and release button in center of patch. A small green light will  flash 3-4 times. This will be your only indicator that the monitor has been turned on.  Do not shower for the first 24 hours. You may shower after the first 24 hours.  Press the button if you feel a symptom. You will hear a small click. Record Date, Time and  Symptom in the Patient Logbook.  When you are ready to remove the patch, follow instructions on the last 2 pages of Patient  Logbook. Stick patch monitor onto the last page of Patient Logbook.  Place Patient Logbook in the blue and white box. Use locking tab on box and tape box closed  securely. The blue and white box has prepaid postage on it. Please place it in the mailbox as  soon as possible. Your physician should have your test results approximately 7 days after the  monitor has been mailed back to [place].  Call [facility] Customer Care at [phone] if you have questions regarding  your ZIO XT patch monitor. Call them immediately if you see an orange light blinking on your  monitor.  If your monitor falls off in less than 4 days, contact our Monitor department at [phone].  If your monitor becomes loose or falls off after 4 days call [employer] at [phone] for  suggestions on securing your monitor    Follow-Up: At [facility],  you and your health needs are our priority.  As part of our continuing mission to provide you with exceptional heart care, we have created designated Provider Care Teams.  These Care Teams include your primary Cardiologist (physician) and Advanced Practice Providers (APPs -  Physician Assistants and Nurse Practitioners) who all work together to provide you with the care you need, when you need it.  Your next appointment:   6 month(s)  The format for your next appointment:   In Person  Provider:   You will see one of the following Advanced Practice Providers on your designated Care Team:   [name], [place] "Mardelle Matte" [place], [place] [name], NP    Thank you for choosing [facility]!!   Dory Horn, RN [phone]

## 2023-03-30 ENCOUNTER — Other Ambulatory Visit: Payer: Self-pay | Admitting: Internal Medicine

## 2023-04-20 IMAGING — DX DG CHEST 2V
2 series · 2 of 2 positions shown · non-contrast
Comparison: None.

CLINICAL DATA: Aortic valve and ascending aorta replacement.
Shortness of breath.

EXAM:
CHEST - 2 VIEW

[chest pa]
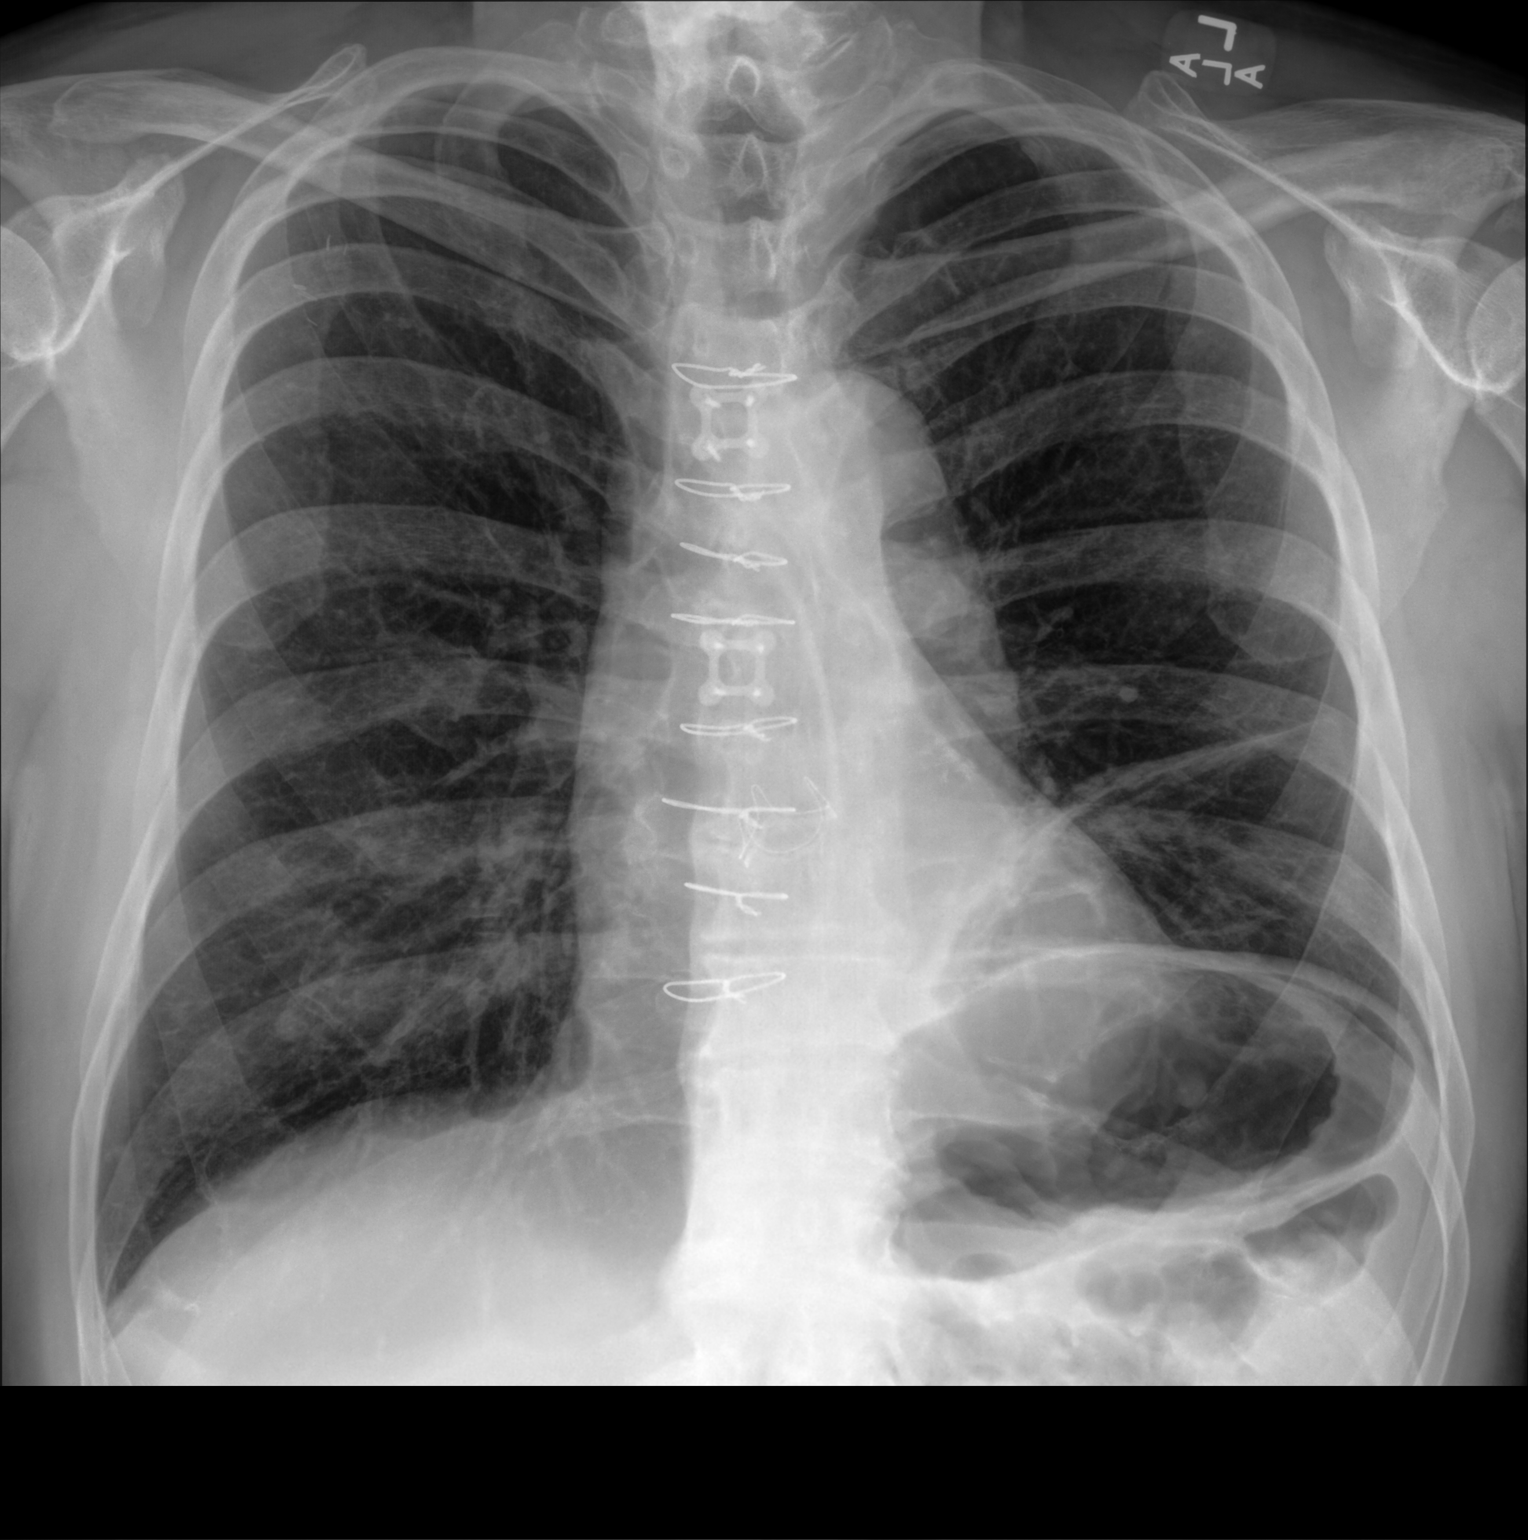

[chest lat]
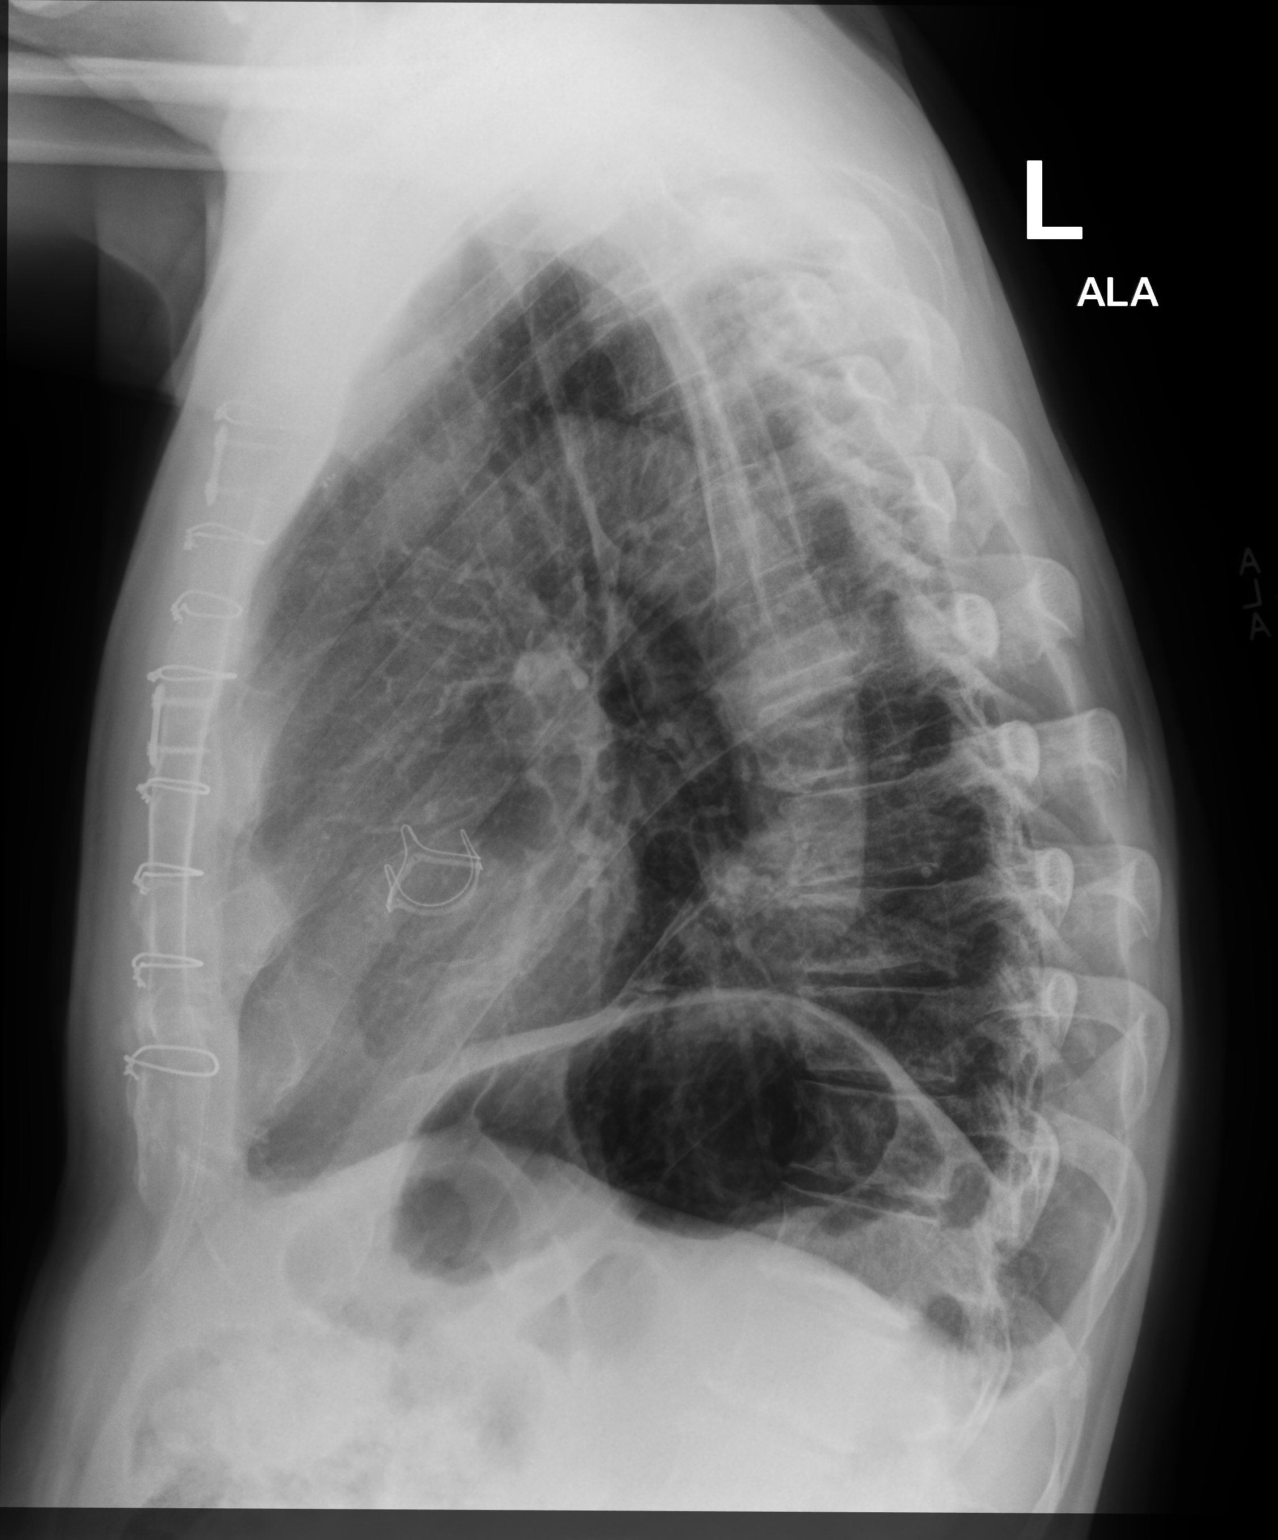

[2 of 2 positions shown; findings below may reference images not displayed]

FINDINGS: Heart size is normal. Patient is status post median sternotomy.
Aortic valve replacement noted. Linear atelectasis present in the
left lower lobe. Left hemidiaphragm is slightly elevated. Lungs are
otherwise clear. No edema or effusion is present. Axial skeleton is
otherwise within normal limits.
IMPRESSION: 1. No acute cardiopulmonary disease.
2. Postsurgical changes as described.

## 2023-05-16 ENCOUNTER — Telehealth: Payer: Self-pay | Admitting: Internal Medicine

## 2023-05-16 NOTE — Telephone Encounter (Signed)
  Per MyChart scheduling message:  Pt c/o BP issue: STAT if pt c/o blurred vision, one-sided weakness or slurred speech  1. What are your last 5 BP readings?   2. Are you having any other symptoms (ex. Dizziness, headache, blurred vision, passed out)?   3. What is your BP issue?    151/96.  174/104. 164/98. 170/101. 189/103.   No outward sytems   The issue is it's high

## 2023-05-16 NOTE — Progress Notes (Unsigned)
Cardiology Office Note:   Date:  05/18/2023  ID:  Daniel Robinson, DOB 08-Mar-1951, MRN [id number] PCP:  [name], [name], MD  [facility] Providers Cardiologist:  [name], MD Referring MD: [name], [name], MD  Chief Complaint/Reason for Referral:  F/U for hypertension, Type A dissection ASSESSMENT:    1. Aortic dissection, thoracoabdominal (HCC)   2. Primary hypertension   3. Hyperlipidemia LDL goal <55   4. Coronary artery calcification seen on CT scan   5. PVC's (premature ventricular contractions)   6. BMI 27.0-27.9,adult     PLAN:   In order of problems listed above: Aortic dissecton s/p repair:  Followed by CT surgery. Hypertension: BP on my measure not at goal of 130/80 mmHg.  Increase olmesartan to 40 mg and check BMP in 1 week.  Arrange for nurse blood pressure check in 2 weeks.  The patient will bring his blood pressure monitor in with him to compare readings. Hyperlipidemia:  Continue Repatha 140mg  q2weeks.  Check lipid panel next week. Coronary artery calcification:  Low risk stress test recently; continue ASA 81mg , Coreg 6.25mg  BID, and Repatha 140mg   q2weeks. PVCs: Continue mexiletine 250 mg twice daily; the patient has EP follow-up with plans for potential monitor to evaluate further.   Elevated BMI:  Diet and exercise modification.            Dispo:  Return in about 6 months (around 11/15/2023).      Medication Adjustments/Labs and Tests Ordered: Current medicines are reviewed at length with the patient today.  Concerns regarding medicines are outlined above.  The following changes have been made:     Labs/tests ordered: Orders Placed This Encounter  Procedures   Basic metabolic panel   Lipid panel    Medication Changes: Meds ordered this encounter  Medications   olmesartan (BENICAR) 40 MG tablet    Sig: Take 1 tablet (40 mg total) by mouth daily.    Dispense:  90 tablet    Refill:  3    Dose change (increased on 05/18/2023)     Current medicines are reviewed at length with the patient today.  The patient does not have concerns regarding medicines.  I spent 33 minutes reviewing all clinical data during and prior to this visit including all relevant imaging studies, laboratories, clinical information from other health systems and prior notes from both Cardiology and other specialties, interviewing the patient, conducting a complete physical examination, and coordinating care in order to formulate a comprehensive and personalized evaluation and treatment plan.   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  He was last seen in September 2024 and was referred for a PET stress test due to atypical chest pain which demonstrated low risk findings.  Due to frequent PVCs, he was referred to EP.  He was started on mexiletine which controlled his PVCs.  The patient had his Benicar decreased to 10 mg after he started mexiletine.  He noticed that his blood pressures increased over the last few months.  He increased his olmesartan back to 20 mg.  He brought his blood pressure and heart rate readings  and for the last few days and they are consistently above 130/80 and ranged mostly in the 150s to 170s with a heart rate between 60 and 70.  He however feels fine.  He denies any chest pain, palpitations, paroxysmal atrial dyspnea, orthopnea.  He is able to do everything that he needs to do in a day.  He has gained a little weight and hopes to exercise a little bit more as the weather warms.  His PVCs have been less bothersome and he is no longer dizzy  like he was before.  He is tolerating his medications without any issues.          Current Medications: Current Meds  Medication Sig   aspirin 81 MG EC tablet Take 81 mg by mouth every evening. Swallow whole.   carvedilol (COREG) 6.25 MG tablet TAKE 1 TABLET BY MOUTH TWICE A DAY   Evolocumab (REPATHA SURECLICK) 140 MG/ML SOAJ Inject 140 mg into the skin every 14 (fourteen) days.   mexiletine (MEXITIL) 250 MG capsule Take 1 capsule (250 mg total) by mouth 2 (two) times daily.   olmesartan (BENICAR) 40 MG tablet Take 1 tablet (40 mg total) by mouth daily.   [DISCONTINUED] olmesartan (BENICAR) 20 MG tablet Take 0.5 tablets (10 mg total) by mouth daily.     Review of Systems:   Please see the history of present illness.    All other systems reviewed and are negative.     EKGs/Labs/Other Test Reviewed:   EKG:  EKG from 10/24 demonstrates sinus bradycardia  EKG Interpretation Date/Time:    Ventricular Rate:    PR Interval:    QRS Duration:    QT Interval:    QTC Calculation:   R Axis:      Text Interpretation:           Risk Assessment/Calculations:          Physical Exam:   VS:  BP 135/70   Pulse 77   Ht 5\' 10"  (1.778 m)   Wt 181 lb 3.2 oz (82.2 kg)   SpO2 97%   BMI 26.00 kg/m        Wt Readings from Last 3 Encounters:  05/18/23 181 lb 3.2 oz (82.2 kg)  01/16/23 186 lb 9.6 oz (84.6 kg)  12/26/22 189 lb 9.6 oz (86 kg)      GENERAL:  No apparent distress, AOx3 HEENT:  No carotid bruits, +2 carotid impulses, no scleral icterus CAR: RRR no murmurs, gallops, rubs, or thrills RES:  Clear to auscultation bilaterally ABD:  Soft, nontender, nondistended, positive bowel sounds x 4 VASC:  +2 radial pulses, +2 carotid pulses NEURO:  CN 2-12 grossly intact; motor and sensory grossly intact PSYCH:  No active depression or anxiety EXT:  No edema, ecchymosis, or cyanosis  Signed, Orbie Pyo, MD  05/18/2023 9:12 AM    [facility] [address] [place], [place], [place]  [address] Phone: [phone]; Fax: [phone]   Note:  This document was prepared using Dragon voice recognition software and may include unintentional dictation errors.

## 2023-05-16 NOTE — Telephone Encounter (Signed)
  Per MyChart scheduling message:   Pt c/o BP issue: STAT if pt c/o blurred vision, one-sided weakness or slurred speech   1. What are your last 5 BP readings?    2. Are you having any other symptoms (ex. Dizziness, headache, blurred vision, passed out)?    3. What is your BP issue?     151/96.  174/104. 164/98. 170/101. 189/103.   No outward sytems   The issue is it's high    Stated taking new meds in October 2024 and was doing good until January then BP has been slowly creeping upwards. Last month of so seeing the above type of numbers. General average  150/90 mark Denies blurry vision or headache, chest pressure. He is questioning if this is all medication related.

## 2023-05-18 ENCOUNTER — Ambulatory Visit: Payer: Medicare Other | Attending: Internal Medicine | Admitting: Internal Medicine

## 2023-05-18 ENCOUNTER — Encounter: Payer: Self-pay | Admitting: Internal Medicine

## 2023-05-18 VITALS — BP 135/70 | HR 77 | Ht 70.0 in | Wt 181.2 lb

## 2023-05-18 DIAGNOSIS — E785 Hyperlipidemia, unspecified: Secondary | ICD-10-CM

## 2023-05-18 DIAGNOSIS — I493 Ventricular premature depolarization: Secondary | ICD-10-CM

## 2023-05-18 DIAGNOSIS — I7103 Dissection of thoracoabdominal aorta: Secondary | ICD-10-CM | POA: Diagnosis not present

## 2023-05-18 DIAGNOSIS — I2510 Atherosclerotic heart disease of native coronary artery without angina pectoris: Secondary | ICD-10-CM

## 2023-05-18 DIAGNOSIS — I10 Essential (primary) hypertension: Secondary | ICD-10-CM

## 2023-05-18 DIAGNOSIS — Z6827 Body mass index (BMI) 27.0-27.9, adult: Secondary | ICD-10-CM

## 2023-05-18 MED ORDER — OLMESARTAN MEDOXOMIL 40 MG PO TABS: 40.0000 mg | ORAL_TABLET | Freq: Every day | ORAL | 3 refills | Status: DC

## 2023-05-18 NOTE — Telephone Encounter (Signed)
 Pt seeing MD today

## 2023-05-18 NOTE — Patient Instructions (Signed)
Medication Instructions:  Your physician has recommended you make the following change in your medication:   1) INCREASE olmesartan (Benicar) to 40 mg daily  *If you need a refill on your cardiac medications before your next appointment, please call your pharmacy*  Lab Work: Next week at [facility]: fasting Lipid panel and BMET You may also go to any of these [facility] locations:   [facility] - [address] ([facility]) - [address] [phone] [address]  If you have labs (blood work) drawn today and your tests are completely normal, you will receive your results only by: [employer] (if you have MyChart) OR A paper copy in the mail If you have any lab test that is abnormal or we need to change your treatment, we will call you to review the results.  Follow-Up: At [facility], you and your health needs are our priority.  As part of our continuing mission to provide you with exceptional heart care, we have created designated Provider Care Teams.  These Care Teams include your primary Cardiologist (physician) and Advanced Practice Providers (APPs -  Physician Assistants and Nurse Practitioners) who all work together to provide you with the care you need, when you need it.  Your next appointment:   2 weeks Nurse Visit (please bring your home blood pressure cuff) Appointment is scheduled for Thursday May 31, 2023 at 11:00 AM  6 month(s) with Tereso Newcomer, PA-C (you will receive a letter in the mail reminding your to call to schedule this appointment)  The format for your next appointment:   In Person  Provider:   Tereso Newcomer, PA-C    Other Instructions HOW TO TAKE YOUR BLOOD PRESSURE Rest 5 minutes before taking your blood pressure. Don't  smoke or drink caffeinated beverages for at least 30 minutes before. Take your blood pressure before (not after) you eat. Sit comfortably with your back supported and both feet on the  floor ( don't cross your legs). Elevate your arm to heart level on a table or a desk. Use the proper sized cuff.  It should fit smoothly and snugly around your bare upper arm. There should be enough room to slip a fingertip under the cuff.  The bottom edge of the cuff should be 1 inch above the crease of the elbow.  Please monitor your blood pressure once daily 2 hours after your am medication. If you blood pressure consistently remains above 140 (systolic) top number or over 90 ( diastolic) bottom number X 3 days consecutively.  Please call our office at [phone] or send Mychart message.       1st Floor: - Lobby - Registration  - Pharmacy  - Lab - Cafe  2nd Floor: - PV Lab - Diagnostic Testing (echo, CT, nuclear med)  3rd Floor: - Vacant  4th Floor: - TCTS (cardiothoracic surgery) - AFib Clinic - Structural Heart Clinic - Vascular Surgery  - Vascular Ultrasound  5th Floor: - HeartCare Cardiology (general and EP) - Clinical Pharmacy for coumadin, hypertension, lipid, weight-loss medications, and med management appointments    Valet parking services will be available as well.

## 2023-05-25 ENCOUNTER — Encounter: Payer: Self-pay | Admitting: Internal Medicine

## 2023-05-25 ENCOUNTER — Other Ambulatory Visit: Payer: Self-pay

## 2023-05-25 DIAGNOSIS — E785 Hyperlipidemia, unspecified: Secondary | ICD-10-CM

## 2023-05-25 LAB — BASIC METABOLIC PANEL
BUN/Creatinine Ratio: 26 — ABNORMAL HIGH (ref 10–24)
BUN: 22 mg/dL (ref 8–27)
CO2: 23 mmol/L (ref 20–29)
Calcium: 8.8 mg/dL (ref 8.6–10.2)
Chloride: 106 mmol/L (ref 96–106)
Creatinine, Ser: 0.85 mg/dL (ref 0.76–1.27)
Glucose: 80 mg/dL (ref 70–99)
Potassium: 4.1 mmol/L (ref 3.5–5.2)
Sodium: 143 mmol/L (ref 134–144)
eGFR: 93 mL/min/{1.73_m2} (ref >59)

## 2023-05-25 LAB — LIPID PANEL
Chol/HDL Ratio: 3.1 {ratio} (ref 0.0–5.0)
Cholesterol, Total: 131 mg/dL (ref 100–199)
HDL: 42 mg/dL (ref >39)
LDL Chol Calc (NIH): 65 mg/dL (ref 0–99)
Triglycerides: 140 mg/dL (ref 0–149)
VLDL Cholesterol Cal: 24 mg/dL (ref 5–40)

## 2023-05-25 MED ORDER — EZETIMIBE 10 MG PO TABS: 10.0000 mg | ORAL_TABLET | Freq: Every day | ORAL | 3 refills | Status: DC

## 2023-05-31 ENCOUNTER — Ambulatory Visit: Payer: Medicare Other | Attending: Cardiovascular Disease

## 2023-05-31 VITALS — BP 152/90 | HR 66 | Wt 186.0 lb

## 2023-05-31 DIAGNOSIS — I10 Essential (primary) hypertension: Secondary | ICD-10-CM

## 2023-05-31 MED ORDER — TELMISARTAN 80 MG PO TABS: 80.0000 mg | ORAL_TABLET | Freq: Every day | ORAL | 3 refills | Status: DC

## 2023-05-31 NOTE — Progress Notes (Signed)
   Nurse Visit   Date of Encounter: 05/31/2023 ID: MAKALE PINDELL, DOB June 08, 1950, MRN [id number]  PCP:  [name], [name], MD   [facility] Providers Cardiologist:  Orbie Pyo, MD      Visit Details   VS:  BP (!) 152/90 (BP Location: Left Arm, Patient Position: Sitting, Cuff Size: Normal)   Pulse 66   Wt 186 lb (84.4 kg)   BMI 26.69 kg/m  , BMI Body mass index is 26.69 kg/m.  Wt Readings from Last 3 Encounters:  05/31/23 186 lb (84.4 kg)  05/18/23 181 lb 3.2 oz (82.2 kg)  01/16/23 186 lb 9.6 oz (84.6 kg)     Reason for visit: BP Check Performed today:   , Vitals, Provider consulted:Dr [employer]	[place]., and Education Changes (medications, testing, etc.) : STOP OLMESARTAN!! START TELMISARTAN Length of Visit: 10 minutes    Medications Adjustments/Labs and Tests Ordered: No orders of the defined types were placed in this encounter.  No orders of the defined types were placed in this encounter.    Signed, Seab Axel Merilynn Finland, LPN  07/01/9627 52:84 AM

## 2023-06-25 NOTE — Progress Notes (Signed)
  Cardiology Office Note:  .   Date:  07/03/2023  ID:  Daniel Robinson, DOB March 12, 1951, MRN [id number] PCP: [name], [name], MD  [facility] Providers Cardiologist:  Orbie Pyo, MD    History of Present Illness: Daniel Robinson is a 73 y.o. male  with history of with h/o PVCs, hypertension, type a aortic dissection post repair.  Patient comes in for f/u. BP running 150/90 at home in left arm. BP good here right arm 130/70. Feeling good today. Watching salt.   ROS:    Studies Reviewed: Marland Kitchen         Prior CV Studies:   PET stress normal 12/2022   Risk Assessment/Calculations:     HYPERTENSION CONTROL Vitals:   07/03/23 1103 07/03/23 1126  BP: 136/72 (!) 145/80    The patient's blood pressure is elevated above target today.  In order to address the patient's elevated BP: Blood pressure will be monitored at home to determine if medication changes need to be made.; A referral to the [facility] will be placed.          Physical Exam:   VS:  BP (!) 145/80   Pulse 81   Resp 16   Ht 5\' 10"  (1.778 m)   Wt 187 lb 3.2 oz (84.9 kg)   SpO2 97%   BMI 26.86 kg/m    Wt Readings from Last 3 Encounters:  07/03/23 187 lb 3.2 oz (84.9 kg)  05/31/23 186 lb (84.4 kg)  05/18/23 181 lb 3.2 oz (82.2 kg)    GEN: Well nourished, well developed in no acute distress NECK: No JVD; No carotid bruits CARDIAC:  RRR, 3/6 systolic murmur LSB RESPIRATORY:  Clear to auscultation without rales, wheezing or rhonchi  ABDOMEN: Soft, non-tender, non-distended EXTREMITIES:  No edema; No deformity   ASSESSMENT AND PLAN: .    Aortic dissecton s/p repair:  Followed by CT surgery.  Hypertension:  Olmesaratan stopped and placed on telmesartan.BP 150/90's left 128/70 right arm. Will discuss with Dr. Lynnette Caffey how low we want to drive BP with the difference. 2 gm sodium diet.  Hyperlipidemia:  Continue Repatha 140mg  q2weeks.  Check lipid panel next week.  Coronary  artery calcification:  Low risk stress test recently; continue ASA 81mg , Coreg 6.25mg  BID, and Repatha 140mg   q2weeks.  PVCs: Continue mexiletine 250 mg twice daily; the patient needs EP follow-up after monitor -will place today.   Elevated BMI:  Diet and exercise modification.          Dispo: f/u with EP APP after [facility]  Signed, Jacolyn Reedy, PA-C

## 2023-07-03 ENCOUNTER — Ambulatory Visit: Payer: Medicare Other | Attending: Physician Assistant | Admitting: Physician Assistant

## 2023-07-03 ENCOUNTER — Ambulatory Visit (INDEPENDENT_AMBULATORY_CARE_PROVIDER_SITE_OTHER)

## 2023-07-03 ENCOUNTER — Encounter: Payer: Self-pay | Admitting: Physician Assistant

## 2023-07-03 VITALS — BP 145/80 | HR 81 | Resp 16 | Ht 70.0 in | Wt 187.2 lb

## 2023-07-03 DIAGNOSIS — I7103 Dissection of thoracoabdominal aorta: Secondary | ICD-10-CM | POA: Diagnosis not present

## 2023-07-03 DIAGNOSIS — I10 Essential (primary) hypertension: Secondary | ICD-10-CM

## 2023-07-03 DIAGNOSIS — E785 Hyperlipidemia, unspecified: Secondary | ICD-10-CM | POA: Diagnosis not present

## 2023-07-03 DIAGNOSIS — I2510 Atherosclerotic heart disease of native coronary artery without angina pectoris: Secondary | ICD-10-CM | POA: Diagnosis not present

## 2023-07-03 DIAGNOSIS — I493 Ventricular premature depolarization: Secondary | ICD-10-CM

## 2023-07-03 NOTE — Patient Instructions (Signed)
 Medication Instructions:  Your physician recommends that you continue on your current medications as directed. Please refer to the Current Medication list given to you today.  *If you need a refill on your cardiac medications before your next appointment, please call your pharmacy*  Lab Work: NONE If you have labs (blood work) drawn today and your tests are completely normal, you will receive your results only by: MyChart Message (if you have MyChart) OR A paper copy in the mail If you have any lab test that is abnormal or we need to change your treatment, we will call you to review the results.  Testing/Procedures: Daniel Robinson- Long Term Monitor Instructions  Your physician has requested you wear a ZIO patch monitor for 14 days.  This is a single patch monitor. [employer] supplies one patch monitor per enrollment. Additional stickers are not available. Please do not apply patch if you will be having a Nuclear Stress Test,  Echocardiogram, Cardiac CT, MRI, or Chest Xray during the period you would be wearing the  monitor. The patch cannot be worn during these tests. You cannot remove and re-apply the  ZIO XT patch monitor.  Your ZIO patch monitor will be mailed 3 day USPS to your address on file. It may take 3-5 days  to receive your monitor after you have been enrolled.  Once you have received your monitor, please review the enclosed instructions. Your monitor  has already been registered assigning a specific monitor serial # to you.  Billing and Patient Assistance Program Information  We have supplied [employer] with any of your insurance information on file for billing purposes. [employer] offers a sliding scale Patient Assistance Program for patients that do not have  insurance, or whose insurance does not completely cover the cost of the ZIO monitor.  You must apply for the Patient Assistance Program to qualify for this discounted rate.  To apply, please call [employer] at [phone], select  option 4, select option 2, ask to apply for  Patient Assistance Program. Daniel Robinson will ask your household income, and how many people  are in your household. They will quote your out-of-pocket cost based on that information.  [employer] will also be able to set up a 9-month, interest-free payment plan if needed.  Applying the monitor   Shave hair from upper left chest.  Hold abrader disc by orange tab. Rub abrader in 40 strokes over the upper left chest as  indicated in your monitor instructions.  Clean area with 4 enclosed alcohol pads. Let dry.  Apply patch as indicated in monitor instructions. Patch will be placed under collarbone on left  side of chest with arrow pointing upward.  Rub patch adhesive wings for 2 minutes. Remove white label marked "1". Remove the white  label marked "2". Rub patch adhesive wings for 2 additional minutes.  While looking in a mirror, press and release button in center of patch. A small green light will  flash 3-4 times. This will be your only indicator that the monitor has been turned on.  Do not shower for the first 24 hours. You may shower after the first 24 hours.  Press the button if you feel a symptom. You will hear a small click. Record Date, Time and  Symptom in the Patient Logbook.  When you are ready to remove the patch, follow instructions on the last 2 pages of Patient  Logbook. Stick patch monitor onto the last page of Patient Logbook.  Place Patient Logbook in the blue  and white box. Use locking tab on box and tape box closed  securely. The blue and white box has prepaid postage on it. Please place it in the mailbox as  soon as possible. Your physician should have your test results approximately 7 days after the  monitor has been mailed back to [facility].  Call [facility] Customer Care at [phone] if you have questions regarding  your ZIO XT patch monitor. Call them immediately if you see an orange light blinking on your  monitor.   If your monitor falls off in less than 4 days, contact our Monitor department at [phone].  If your monitor becomes loose or falls off after 4 days call [employer] at [phone] for  suggestions on securing your monitor   Follow-Up: At [facility], you and your health needs are our priority.  As part of our continuing mission to provide you with exceptional heart care, our providers are all part of one team.  This team includes your primary Cardiologist (physician) and Advanced Practice Providers or APPs (Physician Assistants and Nurse Practitioners) who all work together to provide you with the care you need, when you need it.  Your next appointment:   1 month(s)  Provider:   You will see one of the following Advanced Practice Providers on your designated Care Team:   Daniel Robinson, Daniel Robinson, [place] Daniel Don, NP Daniel Brim, NP  We recommend signing up for the patient portal called "MyChart".  Sign up information is provided on this After Visit Summary.  MyChart is used to connect with patients for Virtual Visits (Telemedicine).  Patients are able to view lab/test results, encounter notes, upcoming appointments, etc.  Non-urgent messages can be sent to your provider as well.   To learn more about what you can do with MyChart, go to [email or web address].   Other Instructions Two Gram Sodium Diet 2000 mg  What is Sodium? Sodium is a mineral found naturally in many foods. The most significant source of sodium in the diet is table salt, which is about 40% sodium.  Processed, convenience, and preserved foods also contain a large amount of sodium.  The body needs only 500 mg of sodium daily to function,  A normal diet provides more than enough sodium even if you do not use salt.  Why Limit Sodium? A build up of sodium in the body can cause thirst, increased blood pressure, shortness of breath, and water retention.  Decreasing sodium in the diet can reduce  edema and risk of heart attack or stroke associated with high blood pressure.  Keep in mind that there are many other factors involved in these health problems.  Heredity, obesity, lack of exercise, cigarette smoking, stress and what you eat all play a role.  General Guidelines: Do not add salt at the table or in cooking.  One teaspoon of salt contains over 2 grams of sodium. Read food labels Avoid processed and convenience foods Ask your dietitian before eating any foods not dicussed in the menu planning guidelines Consult your physician if you wish to use a salt substitute or a sodium containing medication such as antacids.  Limit milk and milk products to 16 oz (2 cups) per day.  Shopping Hints: READ LABELS!! "Dietetic" does not necessarily mean low sodium. Salt and other sodium ingredients are often added to foods during processing.    Menu Planning Guidelines Food Group Choose More Often Avoid  Beverages (see also the milk group All fruit juices, low-sodium,  salt-free vegetables juices, low-sodium carbonated beverages Regular vegetable or tomato juices, commercially softened water used for drinking or cooking  Breads and Cereals Enriched white, wheat, rye and pumpernickel bread, hard rolls and dinner rolls; muffins, cornbread and waffles; most dry cereals, cooked cereal without added salt; unsalted crackers and breadsticks; low sodium or homemade bread crumbs Bread, rolls and crackers with salted tops; quick breads; instant hot cereals; pancakes; commercial bread stuffing; self-rising flower and biscuit mixes; regular bread crumbs or cracker crumbs  Desserts and Sweets Desserts and sweets mad with mild should be within allowance Instant pudding mixes and cake mixes  Fats Butter or margarine; vegetable oils; unsalted salad dressings, regular salad dressings limited to 1 Tbs; light, sour and heavy cream Regular salad dressings containing bacon fat, bacon bits, and salt pork; snack dips made  with instant soup mixes or processed cheese; salted nuts  Fruits Most fresh, frozen and canned fruits Fruits processed with salt or sodium-containing ingredient (some dried fruits are processed with sodium sulfites        Vegetables Fresh, frozen vegetables and low- sodium canned vegetables Regular canned vegetables, sauerkraut, pickled vegetables, and others prepared in brine; frozen vegetables in sauces; vegetables seasoned with ham, bacon or salt pork  Condiments, Sauces, Miscellaneous  Salt substitute with physician's approval; pepper, herbs, spices; vinegar, lemon or lime juice; hot pepper sauce; garlic powder, onion powder, low sodium soy sauce (1 Tbs.); low sodium condiments (ketchup, chili sauce, mustard) in limited amounts (1 tsp.) fresh ground horseradish; unsalted tortilla chips, pretzels, potato chips, popcorn, salsa (1/4 cup) Any seasoning made with salt including garlic salt, celery salt, onion salt, and seasoned salt; sea salt, rock salt, kosher salt; meat tenderizers; monosodium glutamate; mustard, regular soy sauce, barbecue, sauce, chili sauce, teriyaki sauce, steak sauce, Worcestershire sauce, and most flavored vinegars; canned gravy and mixes; regular condiments; salted snack foods, olives, picles, relish, horseradish sauce, catsup   Food preparation: Try these seasonings Meats:    Pork Sage, onion Serve with applesauce  Chicken Poultry seasoning, thyme, parsley Serve with cranberry sauce  Lamb Curry powder, rosemary, garlic, thyme Serve with mint sauce or jelly  Veal Marjoram, basil Serve with current jelly, cranberry sauce  Beef Pepper, bay leaf Serve with dry mustard, unsalted chive butter  Fish [place] leaf, dill Serve with unsalted lemon butter, unsalted parsley butter  Vegetables:    Asparagus Lemon juice   Broccoli Lemon juice   Carrots Mustard dressing parsley, mint, nutmeg, glazed with unsalted butter and sugar   Green beans Marjoram, lemon juice, nutmeg,dill seed    Tomatoes Basil, marjoram, onion   Spice /blend for [employer]" 4 tsp ground thyme 1 tsp ground sage 3 tsp ground rosemary 4 tsp ground marjoram   Test your knowledge A product that says "Salt Free" may still contain sodium. True or False Garlic Powder and Hot Pepper Sauce an be used as alternative seasonings.True or False Processed foods have more sodium than fresh foods.  True or False Canned Vegetables have less sodium than froze True or False   WAYS TO DECREASE YOUR SODIUM INTAKE Avoid the use of added salt in cooking and at the table.  Table salt (and other prepared seasonings which contain salt) is probably one of the greatest sources of sodium in the diet.  Unsalted foods can gain flavor from the sweet, sour, and butter taste sensations of herbs and spices.  Instead of using salt for seasoning, try the following seasonings with the foods listed.  Remember: how you  use them to enhance natural food flavors is limited only by your creativity... Allspice-Meat, fish, eggs, fruit, peas, red and yellow vegetables Almond Extract-Fruit baked goods Anise Seed-Sweet breads, fruit, carrots, beets, cottage cheese, cookies (tastes like licorice) Basil-Meat, fish, eggs, vegetables, rice, vegetables salads, soups, sauces Bay Leaf-Meat, fish, stews, poultry Burnet-Salad, vegetables (cucumber-like flavor) Caraway Seed-Bread, cookies, cottage cheese, meat, vegetables, cheese, rice Cardamon-Baked goods, fruit, soups Celery Powder or seed-Salads, salad dressings, sauces, meatloaf, soup, bread.Do not use  celery salt Chervil-Meats, salads, fish, eggs, vegetables, cottage cheese (parsley-like flavor) Chili Power-Meatloaf, chicken cheese, corn, eggplant, egg dishes Chives-Salads cottage cheese, egg dishes, soups, vegetables, sauces Cilantro-Salsa, casseroles Cinnamon-Baked goods, fruit, pork, lamb, chicken, carrots Cloves-Fruit, baked goods, fish, pot roast, green beans, beets,  carrots Coriander-Pastry, cookies, meat, salads, cheese (lemon-orange flavor) Cumin-Meatloaf, fish,cheese, eggs, cabbage,fruit pie (caraway flavor) [employer], fruit, eggs, fish, poultry, cottage cheese, vegetables Dill Seed-Meat, cottage cheese, poultry, vegetables, fish, salads, bread Fennel Seed-Bread, cookies, apples, pork, eggs, fish, beets, cabbage, cheese, Licorice-like flavor Garlic-(buds or powder) Salads, meat, poultry, fish, bread, butter, vegetables, potatoes.Do not  use garlic salt Ginger-Fruit, vegetables, baked goods, meat, fish, poultry Horseradish Root-Meet, vegetables, butter Lemon Juice or Extract-Vegetables, fruit, tea, baked goods, fish salads Mace-Baked goods fruit, vegetables, fish, poultry (taste like nutmeg) Maple Extract-Syrups Marjoram-Meat, chicken, fish, vegetables, breads, green salads (taste like Sage) Mint-Tea, lamb, sherbet, vegetables, desserts, carrots, cabbage Mustard, Dry or Seed-Cheese, eggs, meats, vegetables, poultry Nutmeg-Baked goods, fruit, chicken, eggs, vegetables, desserts Onion Powder-Meat, fish, poultry, vegetables, cheese, eggs, bread, rice salads (Do not use   Onion salt) Orange Extract-Desserts, baked goods Oregano-Pasta, eggs, cheese, onions, pork, lamb, fish, chicken, vegetables, green salads Paprika-Meat, fish, poultry, eggs, cheese, vegetables Parsley Flakes-Butter, vegetables, meat fish, poultry, eggs, bread, salads (certain forms may   Contain sodium Pepper-Meat fish, poultry, vegetables, eggs Peppermint Extract-Desserts, baked goods Poppy Seed-Eggs, bread, cheese, fruit dressings, baked goods, noodles, vegetables, cottage  [employer], poultry, meat, fish, cauliflower, turnips,eggs bread Saffron-Rice, bread, veal, chicken, fish, eggs Sage-Meat, fish, poultry, onions, eggplant, tomateos, pork, stews Savory-Eggs, salads, poultry, meat, rice, vegetables, soups, pork Tarragon-Meat,  poultry, fish, eggs, butter, vegetables (licorice-like flavor)  Thyme-Meat, poultry, fish, eggs, vegetables, (clover-like flavor), sauces, soups Tumeric-Salads, butter, eggs, fish, rice, vegetables (saffron-like flavor) Vanilla Extract-Baked goods, candy Vinegar-Salads, vegetables, meat marinades Walnut Extract-baked goods, candy   2. Choose your Foods Wisely   The following is a list of foods to avoid which are high in sodium:  Meats-Avoid all smoked, canned, salt cured, dried and kosher meat and fish as well as Anchovies   [employer] meats:Bologna, Liverwurst, Pastrami Canned meat or fish  Marinated herring Caviar    Pepperoni Corned Beef   Pizza Dried chipped beef  Salami Frozen breaded fish or meat Salt pork Frankfurters or hot dogs  Sardines Gefilte fish   Sausage Ham (boiled ham, Proscuitto Smoked butt    spiced ham)   Spam      TV Dinners Vegetables Canned vegetables (Regular) Relish Canned mushrooms  Sauerkraut Olives    Tomato juice Pickles  Bakery and Dessert Products Canned puddings  Cream pies Cheesecake   Decorated cakes Cookies  Beverages/Juices Tomato juice, regular  Gatorade   V-8 vegetable juice, regular  Breads and Cereals Biscuit mixes   Salted potato chips, corn chips, pretzels Bread stuffing mixes  Salted crackers and rolls Pancake and waffle mixes Self-rising flour  Seasonings Accent    Meat sauces Barbecue sauce  Meat tenderizer Catsup  Monosodium glutamate (MSG) Celery salt   Onion salt Chili sauce   Prepared mustard Garlic salt   Salt, seasoned salt, sea salt Gravy mixes   Soy sauce Horseradish   Steak sauce Ketchup   Tartar sauce Lite salt    Teriyaki sauce Marinade mixes   Worcestershire sauce  Others Baking powder   Cocoa and cocoa mixes Baking soda   Commercial casserole mixes Candy-caramels, chocolate  Dehydrated soups    Bars, fudge,nougats  Instant rice and pasta mixes Canned broth or soup  Maraschino  cherries Cheese, aged and processed cheese and cheese spreads  Learning Assessment Quiz  Indicated T (for True) or F (for False) for each of the following statements:  _____ Fresh fruits and vegetables and unprocessed grains are generally low in sodium _____ Water may contain a considerable amount of sodium, depending on the source _____ You can always tell if a food is high in sodium by tasting it _____ Certain laxatives my be high in sodium and should be avoided unless prescribed   by a physician or pharmacist _____ Salt substitutes may be used freely by anyone on a sodium restricted diet _____ Sodium is present in table salt, food additives and as a natural component of   most foods _____ Table salt is approximately 90% sodium _____ Limiting sodium intake may help prevent excess fluid accumulation in the body _____ On a sodium-restricted diet, seasonings such as bouillon soy sauce, and    cooking wine should be used in place of table salt _____ On an ingredient list, a product which lists monosodium glutamate as the first   ingredient is an appropriate food to include on a low sodium diet  Circle the best answer(s) to the following statements (Hint: there may be more than one correct answer)  11. On a low-sodium diet, some acceptable snack items are:    A. Olives  F. Bean dip   K. Grapefruit juice    B. Salted Pretzels G. Commercial Popcorn   L. Canned peaches    C. Carrot Sticks  H. Bouillon   M. Unsalted nuts   D. [place] fries  I. Peanut butter crackers N. Salami   E. Sweet pickles J. Tomato Juice   O. Pizza  12.  Seasonings that may be used freely on a reduced - sodium diet include   A. Lemon wedges F.Monosodium glutamate K. Celery seed    B.Soysauce   G. Pepper   L. Mustard powder   C. Sea salt  H. Cooking wine  M. Onion flakes   D. Vinegar  E. Prepared horseradish N. Salsa   E. Sage   J. Worcestershire sauce  O. Chutney       1st Floor: - Lobby - Registration   - Pharmacy  - Lab - Cafe  2nd Floor: - PV Lab - Diagnostic Testing (echo, CT, nuclear med)  3rd Floor: - Vacant  4th Floor: - TCTS (cardiothoracic surgery) - AFib Clinic - Structural Heart Clinic - Vascular Surgery  - Vascular Ultrasound  5th Floor: - [facility] (general and EP) - Clinical Pharmacy for coumadin, hypertension, lipid, weight-loss medications, and med management appointments    Valet parking services will be available as well.

## 2023-07-03 NOTE — Progress Notes (Unsigned)
 Applied a 14 day Zio XT monitor to patient in the office  Thukkani to read

## 2023-07-11 ENCOUNTER — Ambulatory Visit: Payer: BLUE CROSS/BLUE SHIELD

## 2023-07-11 VITALS — Ht 70.0 in | Wt 180.0 lb

## 2023-07-11 DIAGNOSIS — Z0000 Encounter for general adult medical examination without abnormal findings: Secondary | ICD-10-CM

## 2023-07-11 NOTE — Patient Instructions (Addendum)
 Daniel Robinson , Thank you for taking time to come for your Medicare Wellness Visit. I appreciate your ongoing commitment to your health goals. Please review the following plan we discussed and let me know if I can assist you in the future.   Referrals/Orders/Follow-Ups/Clinician Recommendations:   This is a list of the screening recommended for you and due dates:  Health Maintenance  Topic Date Due   Colon Cancer Screening  Never done   Zoster (Shingles) Vaccine (1 of 2) Never done   Pneumonia Vaccine (2 of 2 - PCV) 09/08/2020   COVID-19 Vaccine (1 - 2024-25 season) Never done   DTaP/Tdap/Td vaccine (2 - Td or Tdap) 12/21/2022   Flu Shot  10/26/2023   Medicare Annual Wellness Visit  07/10/2024   Hepatitis C Screening  Completed   HPV Vaccine  Aged Out   Meningitis B Vaccine  Aged Out    Advanced directives: (Declined) Advance directive discussed with you today. Even though you declined this today, please call our office should you change your mind, and we can give you the proper paperwork for you to fill out.  Next Medicare Annual Wellness Visit scheduled for next year: Yes

## 2023-07-11 NOTE — Progress Notes (Signed)
 Subjective:   Daniel Robinson is a 73 y.o. who presents for a Medicare Wellness preventive visit.  Visit Complete: Virtual I connected with  Daniel Robinson on 07/11/23 by a audio enabled telemedicine application and verified that I am speaking with the correct person using two identifiers.  Patient Location: Home  Provider Location: Home Office  I discussed the limitations of evaluation and management by telemedicine. The patient expressed understanding and agreed to proceed.  Vital Signs: Because this visit was a virtual/telehealth visit, some criteria may be missing or patient reported. Any vitals not documented were not able to be obtained and vitals that have been documented are patient reported.    Persons Participating in Visit: Patient.  AWV Questionnaire: No: Patient Medicare AWV questionnaire was not completed prior to this visit.  Cardiac Risk Factors include: advanced age (>38men, >101 women);male gender;hypertension     Objective:    Today's Vitals   07/11/23 0843  Weight: 180 lb (81.6 kg)  Height: 5\' 10"  (1.778 m)   Body mass index is 25.83 kg/m.     07/11/2023    8:48 AM 12/07/2022    5:57 PM 06/28/2022    9:27 AM 06/13/2021   11:33 AM  Advanced Directives  Does Patient Have a Medical Advance Directive? No No No No  Would patient like information on creating a medical advance directive? No - Patient declined  No - Patient declined No - Patient declined    Current Medications (verified) Outpatient Encounter Medications as of 07/11/2023  Medication Sig   aspirin 81 MG EC tablet Take 81 mg by mouth every evening. Swallow whole.   carvedilol (COREG) 6.25 MG tablet TAKE 1 TABLET BY MOUTH TWICE A DAY   Evolocumab (REPATHA SURECLICK) 140 MG/ML SOAJ Inject 140 mg into the skin every 14 (fourteen) days.   ezetimibe (ZETIA) 10 MG tablet Take 1 tablet (10 mg total) by mouth daily.   mexiletine (MEXITIL) 250 MG capsule Take 1 capsule (250 mg total) by mouth 2  (two) times daily.   telmisartan (MICARDIS) 80 MG tablet Take 1 tablet (80 mg total) by mouth daily.   No facility-administered encounter medications on file as of 07/11/2023.    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   Coronary artery calcification seen on CT scan 01/02/2023   Myocardial PET 01/02/2023: No ischemia or infarction, EF 54, global myocardial blood flow reserve normal (3.06); + coronary calcification; low risk    Hyperlipidemia    Hypertension    Routine general medical examination at a health care facility 11/14/2022   Past Surgical History:  Procedure Laterality Date   BACK SURGERY     CARDIAC VALVE REPLACEMENT     REPAIR OF ACUTE ASCENDING THORACIC AORTIC DISSECTION  02/10/2021   INTRA-OP TEE, AORTIC VALVE REPLACEMENT, AORTIC ROOT REPLACEMENT, ASCENDING AORTA REPLACEMENT, STERNAL PLATING, PLACEMENT OF THE PREVENA INCISION WOUND MANAGEMENT SYSTEM   SHOULDER SURGERY     Family History  Problem Relation Age of Onset   Hyperlipidemia Mother    Hypertension Mother    Social History   Socioeconomic History   Marital status: Married    Spouse name: Not on file   Number of children: Not on file   Years of education: 14   Highest education level: Some college, no degree  Occupational History   Occupation: Retired  Tobacco Use   Smoking status: Never  Smokeless tobacco: Never  Vaping Use   Vaping status: Not on file  Substance and Sexual Activity   Alcohol use: Not Currently    Comment: socially   Drug use: Never   Sexual activity: Not Currently    Birth control/protection: None  Other Topics Concern   Not on file  Social History Narrative   Not on file   Social Drivers of Health   Financial Resource Strain: Low Risk  (07/11/2023)   Overall Financial Resource Strain (CARDIA)    Difficulty of Paying Living Expenses:  Not hard at all  Food Insecurity: No Food Insecurity (07/11/2023)   Hunger Vital Sign    Worried About Running Out of Food in the Last Year: Never true    Ran Out of Food in the Last Year: Never true  Transportation Needs: No Transportation Needs (07/11/2023)   PRAPARE - Administrator, Civil Service (Medical): No    Lack of Transportation (Non-Medical): No  Physical Activity: Sufficiently Active (07/11/2023)   Exercise Vital Sign    Days of Exercise per Week: 3 days    Minutes of Exercise per Session: 50 min  Stress: No Stress Concern Present (07/11/2023)   [employer] of Occupational Health - Occupational Stress Questionnaire    Feeling of Stress : Not at all  Social Connections: Moderately Isolated (07/11/2023)   Social Connection and Isolation Panel [NHANES]    Frequency of Communication with Friends and Family: More than three times a week    Frequency of Social Gatherings with Friends and Family: More than three times a week    Attends Religious Services: Never    Database administrator or Organizations: No    Attends Engineer, structural: Never    Marital Status: Married    Tobacco Counseling Counseling given: Not Answered    Clinical Intake:  Pre-visit preparation completed: Yes  Pain : No/denies pain     BMI - recorded: 25.83 Nutritional Status: BMI 25 -29 Overweight Nutritional Risks: None Diabetes: No  No results found for: "HGBA1C"   How often do you need to have someone help you when you read instructions, pamphlets, or other written materials from your doctor or pharmacy?: 1 - Never  Interpreter Needed?: No  Information entered by :: Farris Hong LPN   Activities of Daily Living      07/11/2023    8:47 AM  In your present state of health, do you have any difficulty performing the following activities:  Hearing? 0  Vision? 0  Difficulty concentrating or making decisions? 0  Walking or climbing stairs? 0  Dressing or  bathing? 0  Doing errands, shopping? 0  Preparing Food and eating ? N  Using the Toilet? N  In the past six months, have you accidently leaked urine? N  Do you have problems with loss of bowel control? N  Managing your Medications? N  Managing your Finances? N  Housekeeping or managing your Housekeeping? N    Patient Care Team: [name], [name], MD as PCP - General (Family Medicine) Thukkani, Arun K, MD as PCP - Cardiology (Cardiology)  Indicate any recent Medical Services you may have received from other than [facility] providers in the past year (date may be approximate).     Assessment:   This is a routine wellness examination for Daniel Robinson.  Hearing/Vision screen Hearing Screening - Comments:: Denies hearing difficulties   Vision Screening - Comments:: Wears reading glasses - up to date with routine eye exams  with  Dr Marti Sleigh   Goals Addressed               This Visit's Progress     Increase physical activity (pt-stated)        Remain active.       Depression Screen      07/11/2023    8:46 AM 11/14/2022   10:31 AM 06/28/2022    9:26 AM 06/13/2021   11:29 AM 06/08/2021    7:50 AM 04/15/2021    9:30 AM 02/21/2021   10:40 AM  PHQ 2/9 Scores  PHQ - 2 Score 0 0 0 0 0 0 0  PHQ- 9 Score  0         Fall Risk      07/11/2023    8:47 AM 11/14/2022   10:31 AM 06/28/2022    9:27 AM 06/24/2022   11:11 AM 06/13/2021   11:32 AM  Fall Risk   Falls in the past year? 0 0 0 0 0  Number falls in past yr: 0 0 0  0  Injury with Fall? 0 0 0  0  Risk for fall due to : No Fall Risks Other (Comment) No Fall Risks  No Fall Risks  Follow up Falls prevention discussed;Falls evaluation completed Falls evaluation completed Falls prevention discussed      MEDICARE RISK AT HOME:   Medicare Risk at Home Any stairs in or around the home?: Yes If so, are there any without handrails?: No Home free of loose throw rugs in walkways, pet beds, electrical cords, etc?: Yes Adequate lighting in your  home to reduce risk of falls?: Yes Life alert?: No Use of a cane, walker or w/c?: No Grab bars in the bathroom?: Yes Shower chair or bench in shower?: No Elevated toilet seat or a handicapped toilet?: Yes  TIMED UP AND GO:  Was the test performed?  No  Cognitive Function: 6CIT completed        07/11/2023    8:48 AM 06/28/2022    9:28 AM 06/13/2021   11:33 AM  6CIT Screen  What Year? 0 points 0 points 0 points  What month? 0 points 0 points 0 points  What time? 0 points 0 points 0 points  Count back from 20 0 points 0 points 0 points  Months in reverse 0 points 0 points 0 points  Repeat phrase 0 points 0 points 0 points  Total Score 0 points 0 points 0 points    Immunizations Immunization History  Administered Date(s) Administered   Influenza,inj,Quad PF,6+ Mos 12/20/2012, 01/13/2015   Pneumococcal Polysaccharide-23 09/09/2019   Tdap 12/20/2012    Screening Tests Health Maintenance  Topic Date Due   Colonoscopy  Never done   Zoster Vaccines- Shingrix (1 of 2) Never done   Pneumonia Vaccine 63+ Years old (2 of 2 - PCV) 09/08/2020   COVID-19 Vaccine (1 - 2024-25 season) Never done   DTaP/Tdap/Td (2 - Td or Tdap) 12/21/2022   INFLUENZA VACCINE  10/26/2023   Medicare Annual Wellness (AWV)  07/10/2024   Hepatitis C Screening  Completed   HPV VACCINES  Aged Out   Meningococcal B Vaccine  Aged Out    Health Maintenance  Health Maintenance Due  Topic Date Due   Colonoscopy  Never done   Zoster Vaccines- Shingrix (1 of 2) Never done   Pneumonia Vaccine 41+ Years old (2 of 2 - PCV) 09/08/2020   COVID-19 Vaccine (1 - 2024-25 season) Never done  DTaP/Tdap/Td (2 - Td or Tdap) 12/21/2022   Health Maintenance Items Addressed: Patient declined all vaccines and Colonoscopy  Additional Screening:  Vision Screening: Recommended annual ophthalmology exams for early detection of glaucoma and other disorders of the eye.  Dental Screening: Recommended annual dental exams for  proper oral hygiene  Community Resource Referral / Chronic Care Management: CRR required this visit?  No   CCM required this visit?  No     Plan:     I have personally reviewed and noted the following in the patient's chart:   Medical and social history Use of alcohol, tobacco or illicit drugs  Current medications and supplements including opioid prescriptions. Patient is not currently taking opioid prescriptions. Functional ability and status Nutritional status Physical activity Advanced directives List of other physicians Hospitalizations, surgeries, and ER visits in previous 12 months Vitals Screenings to include cognitive, depression, and falls Referrals and appointments  In addition, I have reviewed and discussed with patient certain preventive protocols, quality metrics, and best practice recommendations. A written personalized care plan for preventive services as well as general preventive health recommendations were provided to patient.     Dewayne Ford, LPN   1/61/0960   After Visit Summary: (MyChart) Due to this being a telephonic visit, the after visit summary with patients personalized plan was offered to patient via MyChart   Notes: Nothing significant to report at this time.

## 2023-07-25 ENCOUNTER — Ambulatory Visit: Attending: Cardiovascular Disease | Admitting: Pharmacist

## 2023-07-25 VITALS — BP 120/66 | HR 72

## 2023-07-25 DIAGNOSIS — I10 Essential (primary) hypertension: Secondary | ICD-10-CM | POA: Insufficient documentation

## 2023-07-25 NOTE — Progress Notes (Signed)
 Patient ID: GLADE SAN                 DOB: 07-14-1950                      MRN: [id number]      HPI: Daniel Robinson is a 73 y.o. male referred by Dr. Lorie Rook to HTN clinic. PMH is significant for PVCs, hypertension, type a aortic dissection post repair, HLD. Seen by Dr. Lorie Rook 05/18/23 BP had been elevated and olemsartan was increased to 40mg . At nurse visit, BP was 152/90. Olmesartan  was switched to telmisartan . Large difference noted in L vs R arm. R arm lower.   Patient presents today to hypertension clinic.  He reports recently he has been having a significant amount of PVCs and his blood pressure machine has been giving him an error.  Over the last few days however his blood pressure has been in the low 120s to 110's.  He has been checking on his right arm.  Feels his PVCs are worse in the late morning to afternoon.  Did just finish a 14-day monitor he mailed back last week.  However he does report that his PVCs were not too bad at that time, once he took the monitor off they seem to ramp up.  He notices some lightheadedness that he feels associated with his PVCs.  Occasional lightheadedness when he stands up.  No swelling.  Right arm, manual, sitting, regular cuff :140/60, 130/68, 124/60 Left arm, manual, sitting, regular cuff : 120/60, 120/66   Current HTN meds: carvedilol  6.25mg  twice a day, telmisartan  80mg  daily Previously tried: lisinopril /hydrochlorothiazide , olmesartan , metoprolol   BP goal: 120/80 mmHg (due to hx of type A aortic dissection)  Family History:  Family History  Problem Relation Age of Onset   Hyperlipidemia Mother    Hypertension Mother     Social History: no ETOH, no tobacco  Diet: 1 cup of coffee daily (20oz), wife does cooking, mindful of salt  Exercise: 40 min stair machine, 20 min walk, stretches- 3 times a week  Home BP readings:  Recently reports systolic low 120s to 110s Heart rate generally in the 60s  Wt Readings from Last 3  Encounters:  07/11/23 180 lb (81.6 kg)  07/03/23 187 lb 3.2 oz (84.9 kg)  05/31/23 186 lb (84.4 kg)   BP Readings from Last 3 Encounters:  07/25/23 120/66  07/03/23 (!) 145/80  05/31/23 (!) 152/90   Pulse Readings from Last 3 Encounters:  07/25/23 72  07/03/23 81  05/31/23 66    Renal function: CrCl cannot be calculated (Patient's most recent lab result is older than the maximum 21 days allowed.).  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 Swallow whole.     carvedilol  (COREG ) 6.25 MG tablet TAKE 1 TABLET BY MOUTH TWICE A DAY 180 tablet 2   docusate sodium (COLACE) 50 MG capsule Take 50 mg by mouth daily.     Evolocumab  (  REPATHA  SURECLICK) 140 MG/ML SOAJ Inject 140 mg into the skin every 14 (fourteen) days. 6 mL 3   ezetimibe  (ZETIA ) 10 MG tablet Take 1 tablet (10 mg total) by mouth daily. 90 tablet 3   mexiletine (MEXITIL) 250 MG capsule Take 1 capsule (250 mg total) by mouth 2 (two) times daily. 180 capsule 2   telmisartan  (MICARDIS ) 80 MG tablet Take 1 tablet (80 mg total) by mouth daily. 90 tablet 3   No current facility-administered medications on file prior to visit.    Allergies  Allergen Reactions   Atorvastatin      Muscle pain and fatigue on 40mg  daily   Crestor  [Rosuvastatin ] Other (See Comments)    myalgias   Quinolones Other (See Comments)    Aortic dissection/ aneurysm    Blood pressure 120/66, pulse 72.   Assessment/Plan:     1.  Hypertension -  Hypertension Assessment: After resting repeat blood pressure at goal today in clinic Goal blood pressure less than 120/80 due to history of type aortic dissection Tolerating telmisartan  and carvedilol  Home heart rate typically in the 60s He exercises for about an hour 3 times a week No alcohol Some lightheadedness I did not find a significant difference in blood pressure in his left versus right arm-but have asked him to start checking on both arms Some of the variation in blood pressure may be due to his PVC burden   Plan: For now continue carvedilol  6.25 mg twice a day and telmisartan  80 mg daily Advised patient that his goal would be a blood pressure less than 120/80 due to his aortic dissection Patient to check blood pressure on both arms and to let us  know if his blood pressure is consistently above goal If additional medication is needed, could consider adding amlodipine 2.5 mg daily Potentially could handle increasing carvedilol  to 12.5 mg if needed for blood pressure or PVC management-Home heart rate generally is in the 60s, 72 today   Thank you  Cortlynn Hollinsworth D Tyrell Seifer, Pharm.D, BCACP, CPP [facility] Division of [facility] [address], [place], [place] [address]  Phone: [phone]; Fax: [phone]

## 2023-07-25 NOTE — Patient Instructions (Addendum)
 Your blood pressure goal is < 120/1mmHg   Please continue carvedilol  6.25mg  twice a day and telmisartan  80mg  daily.  Please let us  know if blood pressure is consistently above 120/80  [phone]  Important lifestyle changes to control high blood pressure  Intervention  Effect on the BP   Weight loss Weight loss is one of the most effective lifestyle changes for controlling blood pressure. If you're overweight or obese, losing even a small amount of weight can help reduce blood pressure.    Blood pressure can decrease by 1 millimeter of mercury (mmHg) with each kilogram (about 2.2 pounds) of weight lost.   Exercise regularly As a general goal, aim for 30 minutes of moderate physical activity every day.    Regular physical activity can lower blood pressure by 5 - 8 mmHg.   Eat a healthy diet Eat a diet rich in whole grains, fruits, vegetables, lean meat, and low-fat dairy products. Limit processed foods, saturated fat, and sweets.    A heart-healthy diet can lower high blood pressure by 10 mmHg.   Reduce salt (sodium) in your diet Aim for 000mg  of sodium each day. Avoid deli meats, canned food, and frozen microwave meals which are high in sodium.     Limiting sodium can reduce blood pressure by 5 mmHg.   Limit alcohol One drink equals 12 ounces of beer, 5 ounces of wine, or 1.5 ounces of 80-proof liquor.    Limiting alcohol to < 1 drink a day for women or < 2 drinks a day for men can help lower blood pressure by about 4 mmHg.   To check your pressure at home you will need to:   Sit up in a chair, with feet flat on the floor and back supported. Do not cross your ankles or legs. Rest your left arm so that the cuff is about heart level. If the cuff goes on your upper arm, then just relax your arm on the table, arm of the chair, or your lap. If you have a wrist cuff, hold your wrist against your chest at heart level. Place the cuff snugly around your arm, about 1 inch above  the crease of your elbow. The cords should be inside the groove of your elbow.  Sit quietly, with the cuff in place, for about 5 minutes. Then press the power button to start a reading. Do not talk or move while the reading is taking place.  Record your readings on a sheet of paper. Although most cuffs have a memory, it is often easier to see a pattern developing when the numbers are all in front of you.  You can repeat the reading after 1-3 minutes if it is recommended.   Make sure your bladder is empty and you have not had caffeine or tobacco within the last 30 minutes   Always bring your blood pressure log with you to your appointments. If you have not brought your monitor in to be double checked for accuracy, please bring it to your next appointment.   You can find a list of validated (accurate) blood pressure cuffs at: validatebp.org

## 2023-07-25 NOTE — Assessment & Plan Note (Signed)
 Assessment: After resting repeat blood pressure at goal today in clinic Goal blood pressure less than 120/80 due to history of type aortic dissection Tolerating telmisartan  and carvedilol  Home heart rate typically in the 60s He exercises for about an hour 3 times a week No alcohol Some lightheadedness I did not find a significant difference in blood pressure in his left versus right arm-but have asked him to start checking on both arms Some of the variation in blood pressure may be due to his PVC burden   Plan: For now continue carvedilol  6.25 mg twice a day and telmisartan  80 mg daily Advised patient that his goal would be a blood pressure less than 120/80 due to his aortic dissection Patient to check blood pressure on both arms and to let us  know if his blood pressure is consistently above goal If additional medication is needed, could consider adding amlodipine 2.5 mg daily Potentially could handle increasing carvedilol  to 12.5 mg if needed for blood pressure or PVC management-Home heart rate generally is in the 60s, 72 today

## 2023-07-26 DIAGNOSIS — I493 Ventricular premature depolarization: Secondary | ICD-10-CM

## 2023-08-02 IMAGING — CT CT ANGIO CHEST
2 of 9 series · 12 of 36 positions shown · IV contrast (agent unspecified)
Comparison: None.

CLINICAL DATA: Thoracic aortic aneurysm.

EXAM:
CT ANGIOGRAPHY CHEST WITH CONTRAST
TECHNIQUE: Multidetector CT imaging of the chest was performed using the
standard protocol during bolus administration of intravenous
contrast. Multiplanar CT image reconstructions and MIPs were
obtained to evaluate the vascular anatomy.

[Series 8: cta thorax 2.00 bv36 s3 cor st · coronal · 0.76mm/px · 1 of 181 slices shown]
[im 91/181  mediastinal]
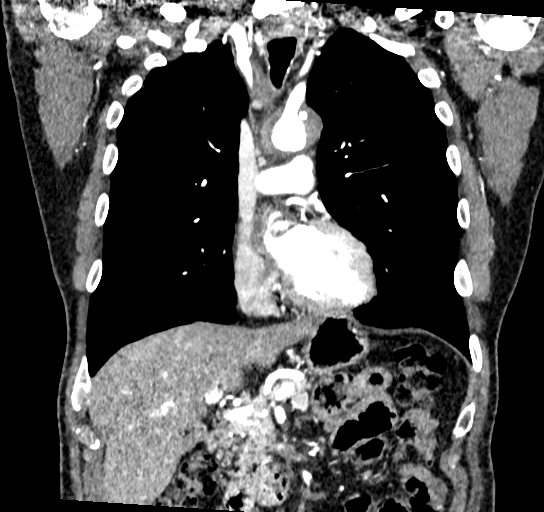

[Series 12: cta thorax 1.00 bv36 s3 arterial thins · axial · arterial · 0.77mm/px · z∈[+1302,+1626]mm · 11 of 650 slices shown]
[im 55/650  lung]
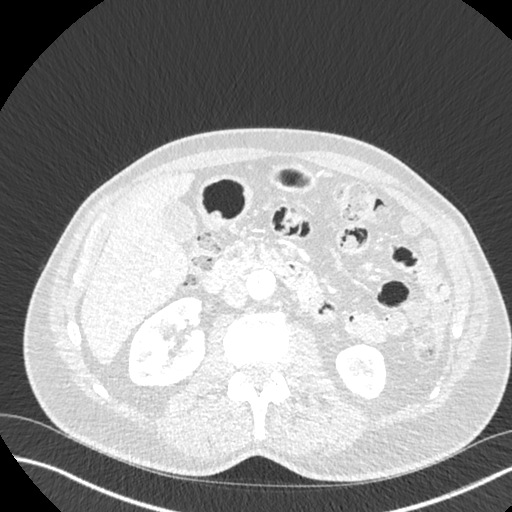
[im 109/650  mediastinal]
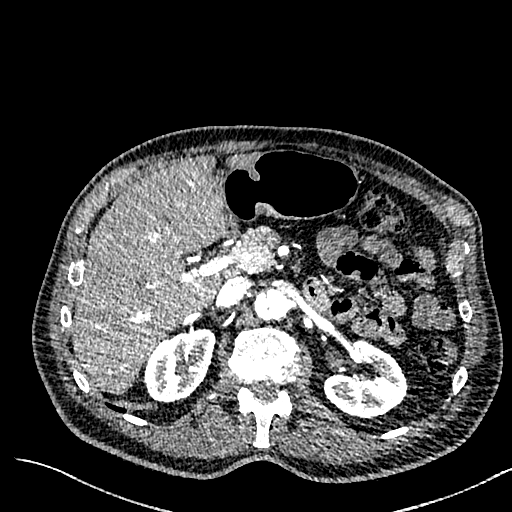
[im 163/650  lung]
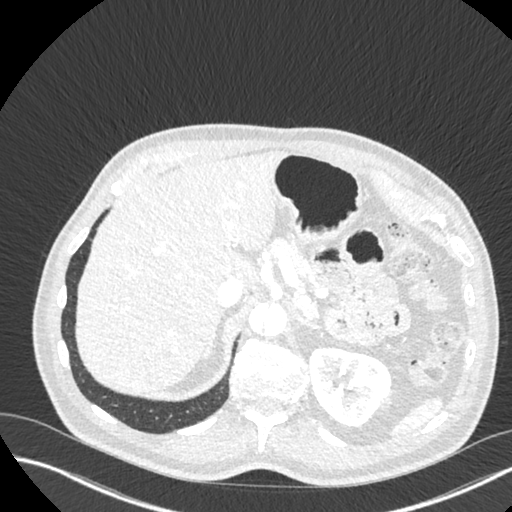
[im 217/650  mediastinal]
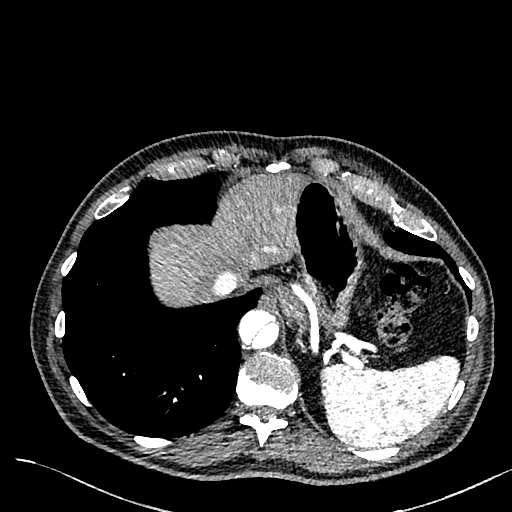
[im 271/650  lung]
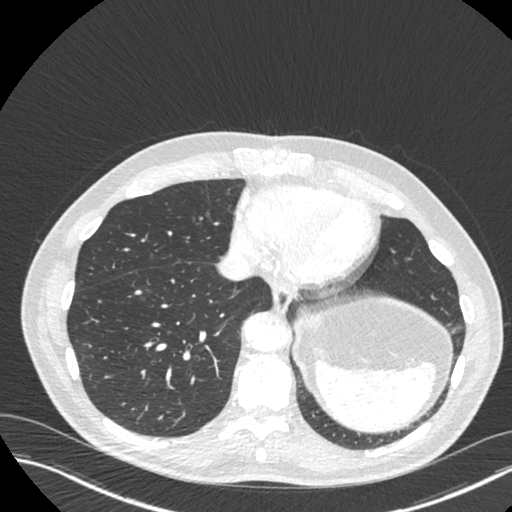
[im 325/650  mediastinal]
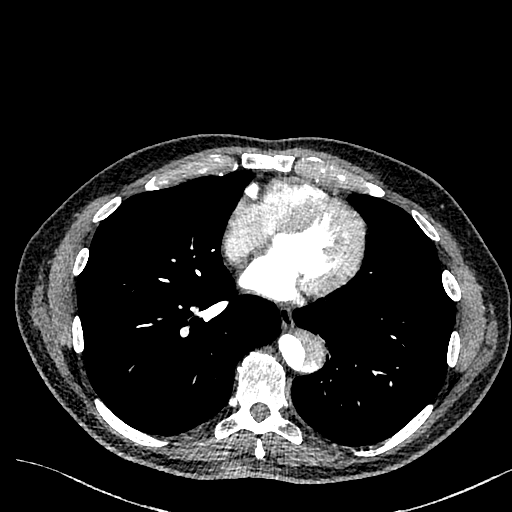
[im 379/650  lung]
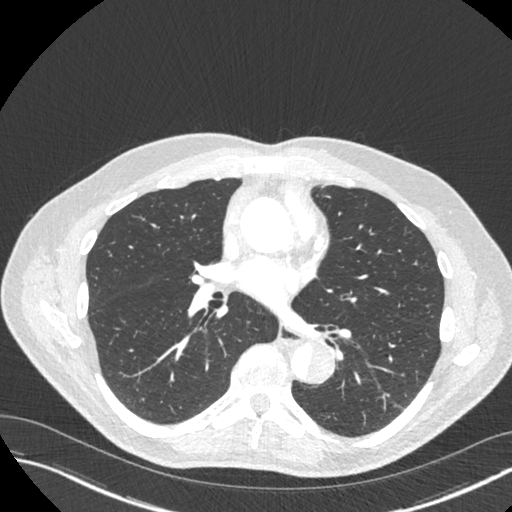
[im 433/650  mediastinal]
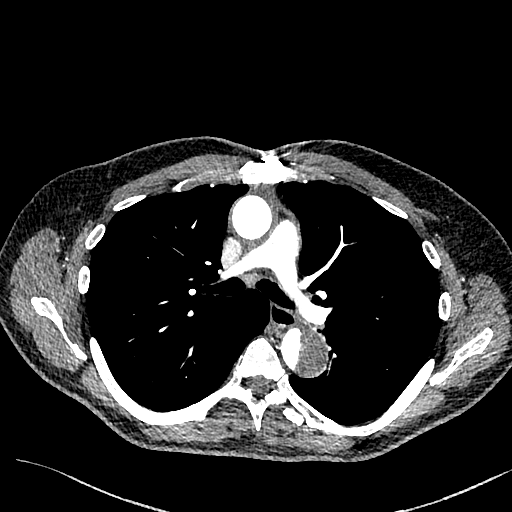
[im 487/650  lung]
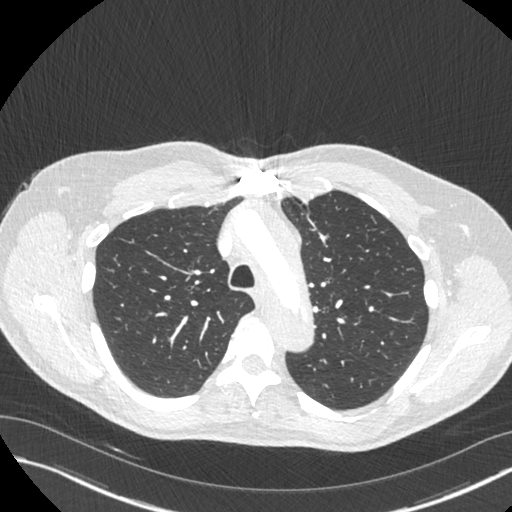
[im 541/650  mediastinal]
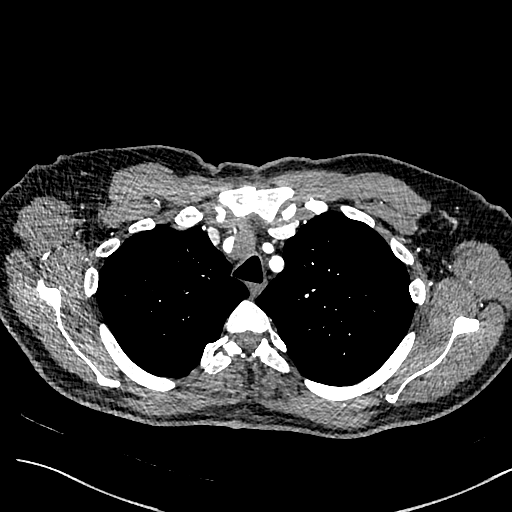
[im 595/650  lung]
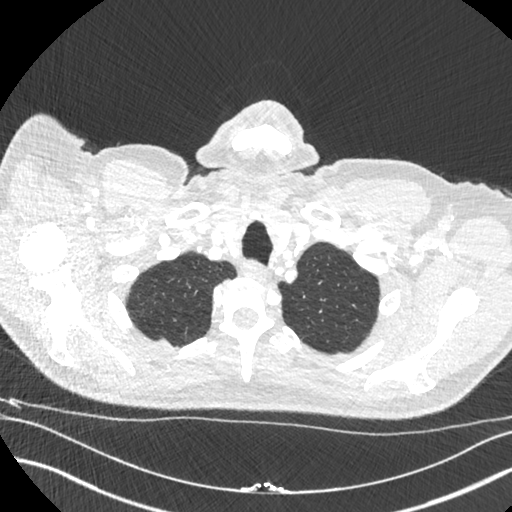

[12 of 36 positions shown; findings below may reference images not displayed]

RADIATION DOSE REDUCTION: This exam was performed according to the
departmental dose-optimization program which includes automated
exposure control, adjustment of the mA and/or kV according to
patient size and/or use of iterative reconstruction technique.

CONTRAST:  80mL [id number] IOPAMIDOL ([id number]) INJECTION 76%
FINDINGS: Cardiovascular: Status post surgical repair of ascending thoracic
aortic aneurysm or dissection. Status post aortic valve repair.
There appears to be a type A dissection that begins around the
origin of the right innominate artery and extends through the
transverse aortic arch and descending thoracic aorta and into the
visualized portion of abdominal aorta. Dissection is seen to extend
into the right innominate artery and into the visualized portion of
the proximal right common carotid artery. Left common carotid artery
is widely patent. Dissection flap is seen extending into the left
subclavian artery. Normal cardiac size. No pericardial effusion.

Mediastinum/Nodes: No enlarged mediastinal, hilar, or axillary lymph
nodes. Thyroid gland, trachea, and esophagus demonstrate no
significant findings.

Lungs/Pleura: Lungs are clear. No pleural effusion or pneumothorax.

Upper Abdomen: Thoracic aortic dissection is seen extending into the
visualized proximal abdominal aorta. Celiac artery appears to arise
from both the true and false lumen. Superior mesenteric artery
arises from true lumen. Left renal artery arises from true lumen.
Dominant inferior right renal artery arises from true lumen. Smaller
superior right renal artery arises from false lumen. Inferior
mesenteric artery arises from false lumen.

Musculoskeletal: No chest wall abnormality. No acute or significant
osseous findings.

Review of the MIP images confirms the above findings.
IMPRESSION: Status post surgical pair of ascending thoracic aortic aneurysm or
dissection. Status post aortic valve repair.

Type A thoracic aortic dissection is noted that begins around the
origin of the right innominate artery and extends to the transverse
aortic arch and through the descending thoracic aorta and into the
visualized portion of proximal abdominal aorta.

Dissection flap is seen to extend into the right innominate artery
and into the visualized portion of proximal right common carotid
artery. Dissection flap is also seen extending into the left
subclavian artery.

## 2023-08-09 ENCOUNTER — Ambulatory Visit: Payer: Self-pay

## 2023-08-21 NOTE — Progress Notes (Unsigned)
  Electrophysiology Office Note:   Date:  08/22/2023  ID:  Daniel Robinson, DOB [birth year], MRN [id number]  Primary Cardiologist: Arun K Thukkani, MD Electrophysiologist: Lei Pump, MD      History of Present Illness:   Daniel Robinson is a 73 y.o. male with h/o HTN, HLD, PVCs, Obesity, and h/o aortic dissection s/p repair seen today for routine electrophysiology followup.   Since last being seen in our clinic the patient reports doing well.  He has been having BPs in the 160-200 systolic at home, along with increased palpitations. Denies chest pain, edema, or syncope. Taking medication as directed. Takes Telmisartan  at night and coreg  BID.   Review of systems complete and found to be negative unless listed in HPI.   EP Information / Studies Reviewed:    EKG is ordered today. Personal review as below.  EKG Interpretation Date/Time:  Wednesday Aug 22 2023 10:40:22 EDT Ventricular Rate:  63 PR Interval:  144 QRS Duration:  96 QT Interval:  418 QTC Calculation: 427 R Axis:   -30  Text Interpretation: Normal sinus rhythm Possible Left atrial enlargement Left axis deviation When compared with ECG of 16-Jan-2023 13:43, Questionable change in QRS axis Confirmed by Pilar Bridge [phone]) on 08/22/2023 10:49:31 AM    Arrhythmia/Device History No specialty comments available.   Monitor 06/2023 HR 49-154 bpm, avg 68 bpm 3 SVT runs, longest 7 beats <1% PACs 3.1% PVCs No AF or VT  Physical Exam:   VS:  BP (!) 174/88   Pulse 63   Ht 5\' 10"  (1.778 m)   Wt 183 lb (83 kg)   SpO2 97%   BMI 26.26 kg/m    Wt Readings from Last 3 Encounters:  08/22/23 183 lb (83 kg)  07/11/23 180 lb (81.6 kg)  07/03/23 187 lb 3.2 oz (84.9 kg)     GEN: No acute distress NECK: No JVD; No carotid bruits CARDIAC: Regular rate and rhythm, no murmurs, rubs, gallops RESPIRATORY:  Clear to auscultation without rales, wheezing or rhonchi  ABDOMEN: Soft, non-tender, non-distended EXTREMITIES:   No edema; No deformity   ASSESSMENT AND PLAN:    PVCs Low burden by monitor Increase coreg  to 12.5 mg BID.  Continue mexitil 250 mg BID  HTN Elevated on arrival Changes as above. Can further increase coreg  if needed.   Follow up with EP APP in 6 months  Signed, Tylene Galla, PA-C

## 2023-08-22 ENCOUNTER — Ambulatory Visit: Attending: Student | Admitting: Student

## 2023-08-22 ENCOUNTER — Encounter: Payer: Self-pay | Admitting: Student

## 2023-08-22 VITALS — BP 174/88 | HR 63 | Ht 70.0 in | Wt 183.0 lb

## 2023-08-22 DIAGNOSIS — I493 Ventricular premature depolarization: Secondary | ICD-10-CM | POA: Insufficient documentation

## 2023-08-22 DIAGNOSIS — I10 Essential (primary) hypertension: Secondary | ICD-10-CM | POA: Diagnosis not present

## 2023-08-22 MED ORDER — CARVEDILOL 12.5 MG PO TABS: 12.5000 mg | ORAL_TABLET | Freq: Two times a day (BID) | ORAL | 6 refills | Status: DC

## 2023-08-22 NOTE — Patient Instructions (Signed)
 Medication Instructions:  Increase coreg  to 12.5 mg BID *If you need a refill on your cardiac medications before your next appointment, please call your pharmacy*  Lab Work: No labwork ordered today. If you have labs (blood work) drawn today and your tests are completely normal, you will receive your results only by: MyChart Message (if you have MyChart) OR A paper copy in the mail If you have any lab test that is abnormal or we need to change your treatment, we will call you to review the results.  Testing/Procedures: No testing ordered today  Follow-Up: At [facility], you and your health needs are our priority.  As part of our continuing mission to provide you with exceptional heart care, our providers are all part of one team.  This team includes your primary Cardiologist (physician) and Advanced Practice Providers or APPs (Physician Assistants and Nurse Practitioners) who all work together to provide you with the care you need, when you need it.  Your next appointment:   6 month(s)  Provider:   You may see Will Cortland Ding, MD or one of the following Advanced Practice Providers on your designated Care Team:   Mertha Abrahams, [place] [address]" [place], PA-C Suzann Riddle, NP Creighton Doffing, NP    We recommend signing up for the patient portal called "MyChart".  Sign up information is provided on this After Visit Summary.  MyChart is used to connect with patients for Virtual Visits (Telemedicine).  Patients are able to view lab/test results, encounter notes, upcoming appointments, etc.  Non-urgent messages can be sent to your provider as well.   To learn more about what you can do with MyChart, go to [email or web address].

## 2023-08-29 ENCOUNTER — Other Ambulatory Visit: Payer: Self-pay | Admitting: Internal Medicine

## 2023-08-29 DIAGNOSIS — I2510 Atherosclerotic heart disease of native coronary artery without angina pectoris: Secondary | ICD-10-CM

## 2023-08-29 DIAGNOSIS — E782 Mixed hyperlipidemia: Secondary | ICD-10-CM

## 2023-09-13 ENCOUNTER — Other Ambulatory Visit: Payer: Self-pay | Admitting: Student

## 2023-09-13 DIAGNOSIS — I493 Ventricular premature depolarization: Secondary | ICD-10-CM

## 2023-09-13 DIAGNOSIS — I10 Essential (primary) hypertension: Secondary | ICD-10-CM

## 2023-09-29 ENCOUNTER — Other Ambulatory Visit: Payer: Self-pay | Admitting: Cardiology

## 2023-11-07 ENCOUNTER — Encounter: Payer: Self-pay | Admitting: Internal Medicine

## 2023-11-07 ENCOUNTER — Encounter: Payer: Self-pay | Admitting: Thoracic Surgery (Cardiothoracic Vascular Surgery)

## 2023-11-29 ENCOUNTER — Other Ambulatory Visit: Payer: Self-pay | Admitting: Thoracic Surgery (Cardiothoracic Vascular Surgery)

## 2023-11-29 DIAGNOSIS — I7103 Dissection of thoracoabdominal aorta: Secondary | ICD-10-CM

## 2023-11-29 DIAGNOSIS — Z9889 Other specified postprocedural states: Secondary | ICD-10-CM

## 2023-12-18 ENCOUNTER — Ambulatory Visit: Payer: Self-pay | Admitting: Internal Medicine

## 2023-12-18 LAB — LIPID PANEL
Chol/HDL Ratio: 2.7 ratio (ref 0.0–5.0)
Cholesterol, Total: 112 mg/dL (ref 100–199)
HDL: 41 mg/dL (ref >39)
LDL Chol Calc (NIH): 44 mg/dL (ref 0–99)
Triglycerides: 158 mg/dL — ABNORMAL HIGH (ref 0–149)
VLDL Cholesterol Cal: 27 mg/dL (ref 5–40)

## 2024-01-01 ENCOUNTER — Ambulatory Visit: Admitting: Thoracic Surgery (Cardiothoracic Vascular Surgery)

## 2024-01-08 ENCOUNTER — Ambulatory Visit (HOSPITAL_COMMUNITY)
Admission: RE | Admit: 2024-01-08 | Discharge: 2024-01-08 | Disposition: A | Discharge status: Home / Self Care | Source: Ambulatory Visit | Attending: Thoracic Surgery (Cardiothoracic Vascular Surgery) | Admitting: Thoracic Surgery (Cardiothoracic Vascular Surgery)

## 2024-01-08 DIAGNOSIS — I7103 Dissection of thoracoabdominal aorta: Secondary | ICD-10-CM | POA: Diagnosis present

## 2024-01-08 DIAGNOSIS — R918 Other nonspecific abnormal finding of lung field: Secondary | ICD-10-CM | POA: Diagnosis not present

## 2024-01-08 DIAGNOSIS — Z9889 Other specified postprocedural states: Secondary | ICD-10-CM | POA: Diagnosis not present

## 2024-01-08 LAB — POCT I-STAT CREATININE: Creatinine, Ser: 1 mg/dL (ref 0.61–1.24)

## 2024-01-08 MED ORDER — IOHEXOL 350 MG/ML SOLN
75.0000 mL | Freq: Once | INTRAVENOUS | Status: AC | PRN
  Administered 2024-01-08: 75 mL via INTRAVENOUS

## 2024-01-14 NOTE — Progress Notes (Unsigned)
 [address] [place] [address]             [phone]            Daniel Robinson [id number] Jul 02, 1950   History of Present Illness:  Daniel Robinson is a 73 year old man with medical history of hypertension, PVC's, type 1 aortic dissection and hyperlipidemia who present for continue postoperative follow up.  He had repair of aortic dissection in [place]  in 2022 with a hemiarch repair with a Resilia bovine pericardial aortic valve conduit.    Current Outpatient Medications on File Prior to Visit  Medication Sig Dispense Refill   Evolocumab  (REPATHA  SURECLICK) 140 MG/ML SOAJ INJECT 140 MG INTO THE SKIN EVERY 14 (FOURTEEN) DAYS. 6 mL 1   aspirin 81 MG EC tablet Take 81 mg by mouth every evening. Swallow whole.     carvedilol  (COREG ) 12.5 MG tablet TAKE 1 TABLET BY MOUTH 2 TIMES DAILY. 180 tablet 3   docusate sodium (COLACE) 50 MG capsule Take 50 mg by mouth daily.     ezetimibe  (ZETIA ) 10 MG tablet Take 1 tablet (10 mg total) by mouth daily. 90 tablet 3   mexiletine (MEXITIL) 250 MG capsule TAKE 1 CAPSULE BY MOUTH 2 TIMES DAILY. 180 capsule 2   telmisartan  (MICARDIS ) 80 MG tablet Take 1 tablet (80 mg total) by mouth daily. 90 tablet 3   No current facility-administered medications on file prior to visit.     ROS: ROS   There were no vitals taken for this visit.  Physical Exam   Imaging:  CLINICAL DATA:  Status post aortic dissection, follow-up exam   EXAM: CT ANGIOGRAPHY CHEST WITH CONTRAST   TECHNIQUE: Multidetector CT imaging of the chest was performed using the standard protocol during bolus administration of intravenous contrast. Multiplanar CT image reconstructions and MIPs were obtained to evaluate the vascular anatomy.   RADIATION DOSE REDUCTION: This exam was performed according to the departmental dose-optimization program which includes automated exposure control, adjustment of the mA and/or kV according  to patient size and/or use of iterative reconstruction technique.   CONTRAST:  75mL OMNIPAQUE  IOHEXOL  350 MG/ML SOLN   COMPARISON:  12/07/2022   FINDINGS: Cardiovascular: There findings consistent with prior aortic valve replacement and ascending aortic repair. The overall appearance is stable from the prior exam. No recurrent aneurysmal dilatation or dissection is seen in the ascending aorta. Thoracic aortic arch and proximal descending thoracic aorta are within normal limits. At the level of the aortic hiatus, the false lumen is again opacified in a similar fashion to that seen on the prior exam. Celiac axis is patent over-riding the flap in a similar fashion to that seen previously. No cardiac enlargement is seen. The pulmonary artery as visualized is within normal limits although not timed for embolus evaluation. Coronary calcifications are again noted.   Mediastinum/Nodes: Thoracic inlet is within normal limits. No hilar or mediastinal adenopathy is noted. The esophagus as visualized is within normal limits.   Lungs/Pleura: Lungs are well aerated bilaterally. No focal infiltrate or sizable effusion is seen. Focal linear density is noted along the major fissure best seen on image number 89 of series 302 stable from the prior exam consistent with a benign etiology. No new parenchymal nodules are seen. A few scattered calcified granulomas are seen.   Upper Abdomen: Visualized upper abdomen shows no acute abnormality.   Musculoskeletal: Degenerative changes of the thoracic spine  are noted.   Review of the MIP images confirms the above findings.   IMPRESSION: Status post aortic valve and ascending aortic repair. The overall appearance is similar to that seen on the prior exam.   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 The overall appearance is stable from the prior exam but incompletely evaluated on this study as the inferior  most aspect of the dissection is not included in the imaging.   Chronic changes in the lungs.  No further follow-up is recommended.     Electronically Signed   By: Daniel Robinson M.D.   On: 01/08/2024 12:27   A/P:  S/P aortic dissection repair   Aortic dissection, thoracoabdominal (HCC) ***      Risk Modification:  Statin:  ***  Smoking cessation instruction/counseling given:  {CHL AMB [facility] SMOKING CESSATION COUNSELING:20758}  Patient was counseled on importance of Blood Pressure Control  They are instructed to contact their Primary Care Physician if they start to have blood pressure readings over 130s/90s. Do not ever stop blood pressure medications on your own, unless instructed by healthcare professional.  Please avoid use of Fluoroquinolones as this can potentially increase your risk of Aortic Rupture and/or Dissection  Patient educated on signs and symptoms of Aortic Dissection, handout also provided in AVS  Daniel CHRISTELLA Rough, PA-C 01/14/24

## 2024-01-15 ENCOUNTER — Other Ambulatory Visit: Payer: Self-pay | Admitting: Thoracic Surgery (Cardiothoracic Vascular Surgery)

## 2024-01-15 ENCOUNTER — Ambulatory Visit

## 2024-01-15 ENCOUNTER — Ambulatory Visit: Admitting: Thoracic Surgery (Cardiothoracic Vascular Surgery)

## 2024-01-15 VITALS — BP 130/72 | HR 62 | Resp 20 | Ht 70.0 in | Wt 187.0 lb

## 2024-01-15 DIAGNOSIS — I7103 Dissection of thoracoabdominal aorta: Secondary | ICD-10-CM

## 2024-01-15 DIAGNOSIS — Z9889 Other specified postprocedural states: Secondary | ICD-10-CM

## 2024-01-15 NOTE — Patient Instructions (Signed)
-  Ordered CTA of abdomen and pelvis. Please schedule with imaging when they call -Follow up in one year with CTA of chest/abdomen/pelvis

## 2024-01-17 ENCOUNTER — Encounter (HOSPITAL_COMMUNITY): Payer: Self-pay

## 2024-01-17 ENCOUNTER — Ambulatory Visit (HOSPITAL_COMMUNITY)

## 2024-01-18 ENCOUNTER — Ambulatory Visit (HOSPITAL_COMMUNITY)
Admission: RE | Admit: 2024-01-18 | Discharge: 2024-01-18 | Disposition: A | Discharge status: Home / Self Care | Source: Ambulatory Visit | Attending: Thoracic Surgery (Cardiothoracic Vascular Surgery) | Admitting: Thoracic Surgery (Cardiothoracic Vascular Surgery)

## 2024-01-18 ENCOUNTER — Other Ambulatory Visit: Payer: Self-pay | Admitting: Internal Medicine

## 2024-01-18 DIAGNOSIS — Z9889 Other specified postprocedural states: Secondary | ICD-10-CM | POA: Insufficient documentation

## 2024-01-18 DIAGNOSIS — I2510 Atherosclerotic heart disease of native coronary artery without angina pectoris: Secondary | ICD-10-CM

## 2024-01-18 DIAGNOSIS — E782 Mixed hyperlipidemia: Secondary | ICD-10-CM

## 2024-01-18 MED ORDER — IOHEXOL 350 MG/ML SOLN
100.0000 mL | Freq: Once | INTRAVENOUS | Status: AC | PRN
  Administered 2024-01-18: 100 mL via INTRAVENOUS

## 2024-01-18 NOTE — Telephone Encounter (Signed)
 Refill Request.

## 2024-01-21 ENCOUNTER — Telehealth: Payer: Self-pay

## 2024-01-21 NOTE — Telephone Encounter (Signed)
-  Called patient with CTA of abdomen and pelvis results -Similar appearance of chronic dissection involving the abdominal aorta and extending into the distal right common iliac artery. -Discussed continuance of blood pressure control -Follow up in one year with CTA of chest/abd/pelvis for continued monitoring

## 2024-01-25 ENCOUNTER — Other Ambulatory Visit: Payer: Self-pay | Admitting: Medical Genetics

## 2024-01-25 DIAGNOSIS — Z006 Encounter for examination for normal comparison and control in clinical research program: Secondary | ICD-10-CM

## 2024-03-14 LAB — GENECONNECT MOLECULAR SCREEN: Genetic Analysis Overall Interpretation: NEGATIVE

## 2024-03-31 ENCOUNTER — Ambulatory Visit: Admitting: Family Medicine

## 2024-04-12 ENCOUNTER — Other Ambulatory Visit: Payer: Self-pay | Admitting: Cardiovascular Disease

## 2024-04-12 DIAGNOSIS — I10 Essential (primary) hypertension: Secondary | ICD-10-CM

## 2024-04-13 ENCOUNTER — Other Ambulatory Visit: Payer: Self-pay | Admitting: Internal Medicine

## 2024-04-16 NOTE — Telephone Encounter (Signed)
 In accordance with refill protocols, please review and address the following requirements before this medication refill can be authorized:  Labs

## 2024-04-29 ENCOUNTER — Telehealth: Payer: Self-pay | Admitting: Pharmacy Technician

## 2024-04-29 ENCOUNTER — Other Ambulatory Visit (HOSPITAL_COMMUNITY): Payer: Self-pay

## 2024-04-29 NOTE — Telephone Encounter (Signed)
 Faxed insurance information for the repatha  prior authorization

## 2024-04-30 NOTE — Telephone Encounter (Signed)
 Pharmacy Patient Advocate Encounter  Received notification from [employer] that Prior Authorization for repatha  has been APPROVED from 03/27/24 to 04/30/25

## 2024-07-16 ENCOUNTER — Ambulatory Visit
